# Patient Record
Sex: Female | Born: 1935 | Race: Black or African American | Hispanic: No | Smoking: Never smoker
Health system: Southern US, Community
[De-identification: ages and names within clinical notes are randomized; demographics above are authoritative.]

## PROBLEM LIST (undated history)

## (undated) DIAGNOSIS — M109 Gout, unspecified: Secondary | ICD-10-CM

## (undated) DIAGNOSIS — M1990 Unspecified osteoarthritis, unspecified site: Secondary | ICD-10-CM

## (undated) DIAGNOSIS — I482 Chronic atrial fibrillation, unspecified: Secondary | ICD-10-CM

## (undated) DIAGNOSIS — I209 Angina pectoris, unspecified: Secondary | ICD-10-CM

## (undated) DIAGNOSIS — I639 Cerebral infarction, unspecified: Secondary | ICD-10-CM

## (undated) DIAGNOSIS — C50911 Malignant neoplasm of unspecified site of right female breast: Secondary | ICD-10-CM

## (undated) DIAGNOSIS — I2510 Atherosclerotic heart disease of native coronary artery without angina pectoris: Secondary | ICD-10-CM

## (undated) DIAGNOSIS — G43909 Migraine, unspecified, not intractable, without status migrainosus: Secondary | ICD-10-CM

## (undated) DIAGNOSIS — I10 Essential (primary) hypertension: Secondary | ICD-10-CM

## (undated) DIAGNOSIS — I509 Heart failure, unspecified: Secondary | ICD-10-CM

## (undated) HISTORY — PX: TUBAL LIGATION: SHX77

## (undated) HISTORY — PX: VAGINAL HYSTERECTOMY: SUR661

## (undated) HISTORY — PX: BREAST LUMPECTOMY: SHX2

## (undated) HISTORY — PX: APPENDECTOMY: SHX54

## (undated) HISTORY — PX: CORONARY ANGIOPLASTY WITH STENT PLACEMENT: SHX49

## (undated) HISTORY — PX: HEMORROIDECTOMY: SUR656

## (undated) HISTORY — PX: TONSILLECTOMY: SUR1361

## (undated) HISTORY — PX: CATARACT EXTRACTION W/ INTRAOCULAR LENS  IMPLANT, BILATERAL: SHX1307

## (undated) HISTORY — PX: BREAST BIOPSY: SHX20

## (undated) HISTORY — PX: CHOLECYSTECTOMY: SHX55

## (undated) HISTORY — PX: MASTECTOMY: SHX3

---

## 1998-01-23 ENCOUNTER — Ambulatory Visit (HOSPITAL_COMMUNITY): Admission: RE | Admit: 1998-01-23 | Discharge: 1998-01-23 | Payer: Self-pay | Admitting: General Surgery

## 1998-02-09 ENCOUNTER — Ambulatory Visit (HOSPITAL_COMMUNITY): Admission: RE | Admit: 1998-02-09 | Discharge: 1998-02-09 | Payer: Self-pay | Admitting: Cardiology

## 1998-02-10 ENCOUNTER — Other Ambulatory Visit: Admission: RE | Admit: 1998-02-10 | Discharge: 1998-02-10 | Payer: Self-pay | Admitting: *Deleted

## 1999-03-01 ENCOUNTER — Inpatient Hospital Stay (HOSPITAL_COMMUNITY): Admission: EM | Admit: 1999-03-01 | Discharge: 1999-03-03 | Payer: Self-pay | Admitting: Emergency Medicine

## 1999-03-01 ENCOUNTER — Encounter: Payer: Self-pay | Admitting: Emergency Medicine

## 1999-04-13 ENCOUNTER — Other Ambulatory Visit: Admission: RE | Admit: 1999-04-13 | Discharge: 1999-04-13 | Payer: Self-pay | Admitting: *Deleted

## 1999-07-05 ENCOUNTER — Emergency Department (HOSPITAL_COMMUNITY): Admission: EM | Admit: 1999-07-05 | Discharge: 1999-07-05 | Payer: Self-pay | Admitting: Emergency Medicine

## 1999-07-05 ENCOUNTER — Encounter: Payer: Self-pay | Admitting: Emergency Medicine

## 1999-07-18 ENCOUNTER — Ambulatory Visit (HOSPITAL_COMMUNITY): Admission: RE | Admit: 1999-07-18 | Discharge: 1999-07-18 | Payer: Self-pay | Admitting: Cardiology

## 1999-07-18 ENCOUNTER — Encounter: Payer: Self-pay | Admitting: Cardiology

## 1999-07-24 ENCOUNTER — Inpatient Hospital Stay (HOSPITAL_COMMUNITY): Admission: RE | Admit: 1999-07-24 | Discharge: 1999-07-25 | Payer: Self-pay

## 2000-02-25 ENCOUNTER — Other Ambulatory Visit: Admission: RE | Admit: 2000-02-25 | Discharge: 2000-02-25 | Payer: Self-pay | Admitting: *Deleted

## 2000-04-03 ENCOUNTER — Encounter: Admission: RE | Admit: 2000-04-03 | Discharge: 2000-04-03 | Payer: Self-pay | Admitting: General Surgery

## 2000-04-03 ENCOUNTER — Encounter: Payer: Self-pay | Admitting: General Surgery

## 2000-09-27 ENCOUNTER — Ambulatory Visit (HOSPITAL_COMMUNITY): Admission: RE | Admit: 2000-09-27 | Discharge: 2000-09-27 | Payer: Self-pay | Admitting: Cardiology

## 2000-09-27 ENCOUNTER — Encounter: Payer: Self-pay | Admitting: Cardiology

## 2000-11-06 ENCOUNTER — Ambulatory Visit (HOSPITAL_COMMUNITY): Admission: RE | Admit: 2000-11-06 | Discharge: 2000-11-06 | Payer: Self-pay | Admitting: *Deleted

## 2000-11-13 ENCOUNTER — Ambulatory Visit (HOSPITAL_COMMUNITY): Admission: RE | Admit: 2000-11-13 | Discharge: 2000-11-13 | Payer: Self-pay | Admitting: *Deleted

## 2001-02-23 ENCOUNTER — Ambulatory Visit (HOSPITAL_COMMUNITY): Admission: RE | Admit: 2001-02-23 | Discharge: 2001-02-23 | Payer: Self-pay | Admitting: Cardiology

## 2001-02-23 ENCOUNTER — Encounter: Payer: Self-pay | Admitting: Cardiology

## 2001-02-24 ENCOUNTER — Other Ambulatory Visit: Admission: RE | Admit: 2001-02-24 | Discharge: 2001-02-24 | Payer: Self-pay | Admitting: *Deleted

## 2001-04-06 ENCOUNTER — Encounter: Payer: Self-pay | Admitting: General Surgery

## 2001-04-06 ENCOUNTER — Encounter: Admission: RE | Admit: 2001-04-06 | Discharge: 2001-04-06 | Payer: Self-pay | Admitting: General Surgery

## 2001-06-01 ENCOUNTER — Encounter: Admission: RE | Admit: 2001-06-01 | Discharge: 2001-06-01 | Payer: Self-pay | Admitting: *Deleted

## 2001-06-01 ENCOUNTER — Encounter: Payer: Self-pay | Admitting: *Deleted

## 2001-08-31 ENCOUNTER — Encounter: Payer: Self-pay | Admitting: Cardiology

## 2001-08-31 ENCOUNTER — Ambulatory Visit (HOSPITAL_COMMUNITY): Admission: RE | Admit: 2001-08-31 | Discharge: 2001-08-31 | Payer: Self-pay | Admitting: Cardiology

## 2001-09-10 ENCOUNTER — Ambulatory Visit (HOSPITAL_COMMUNITY): Admission: RE | Admit: 2001-09-10 | Discharge: 2001-09-10 | Payer: Self-pay | Admitting: Cardiology

## 2002-04-07 ENCOUNTER — Encounter: Payer: Self-pay | Admitting: General Surgery

## 2002-04-07 ENCOUNTER — Encounter: Admission: RE | Admit: 2002-04-07 | Discharge: 2002-04-07 | Payer: Self-pay | Admitting: General Surgery

## 2002-04-25 ENCOUNTER — Inpatient Hospital Stay (HOSPITAL_COMMUNITY): Admission: EM | Admit: 2002-04-25 | Discharge: 2002-04-26 | Payer: Self-pay | Admitting: Emergency Medicine

## 2002-04-25 ENCOUNTER — Encounter: Payer: Self-pay | Admitting: Emergency Medicine

## 2002-04-26 ENCOUNTER — Encounter: Payer: Self-pay | Admitting: Cardiology

## 2002-05-27 ENCOUNTER — Encounter: Admission: RE | Admit: 2002-05-27 | Discharge: 2002-08-25 | Payer: Self-pay

## 2002-07-09 ENCOUNTER — Encounter: Admission: RE | Admit: 2002-07-09 | Discharge: 2002-09-10 | Payer: Self-pay | Admitting: Neurology

## 2002-09-20 ENCOUNTER — Encounter: Admission: RE | Admit: 2002-09-20 | Discharge: 2002-10-04 | Payer: Self-pay | Admitting: Otolaryngology

## 2003-06-08 ENCOUNTER — Encounter: Admission: RE | Admit: 2003-06-08 | Discharge: 2003-06-08 | Payer: Self-pay | Admitting: General Surgery

## 2003-10-19 ENCOUNTER — Encounter: Admission: RE | Admit: 2003-10-19 | Discharge: 2003-10-19 | Payer: Self-pay | Admitting: Cardiology

## 2004-06-18 ENCOUNTER — Encounter: Admission: RE | Admit: 2004-06-18 | Discharge: 2004-06-18 | Payer: Self-pay | Admitting: General Surgery

## 2005-06-19 ENCOUNTER — Encounter: Admission: RE | Admit: 2005-06-19 | Discharge: 2005-06-19 | Payer: Self-pay | Admitting: Cardiology

## 2005-09-10 ENCOUNTER — Encounter: Admission: RE | Admit: 2005-09-10 | Discharge: 2005-09-10 | Payer: Self-pay | Admitting: Rheumatology

## 2006-03-01 ENCOUNTER — Emergency Department (HOSPITAL_COMMUNITY): Admission: EM | Admit: 2006-03-01 | Discharge: 2006-03-02 | Payer: Self-pay | Admitting: Emergency Medicine

## 2006-06-24 ENCOUNTER — Encounter: Admission: RE | Admit: 2006-06-24 | Discharge: 2006-06-24 | Payer: Self-pay | Admitting: Internal Medicine

## 2006-10-08 ENCOUNTER — Encounter (INDEPENDENT_AMBULATORY_CARE_PROVIDER_SITE_OTHER): Payer: Self-pay | Admitting: Specialist

## 2006-10-08 ENCOUNTER — Ambulatory Visit (HOSPITAL_COMMUNITY): Admission: RE | Admit: 2006-10-08 | Discharge: 2006-10-08 | Payer: Self-pay | Admitting: Gastroenterology

## 2007-07-03 ENCOUNTER — Encounter: Admission: RE | Admit: 2007-07-03 | Discharge: 2007-07-03 | Payer: Self-pay | Admitting: Internal Medicine

## 2008-01-28 ENCOUNTER — Inpatient Hospital Stay (HOSPITAL_COMMUNITY): Admission: EM | Admit: 2008-01-28 | Discharge: 2008-01-30 | Payer: Self-pay | Admitting: Emergency Medicine

## 2008-01-29 ENCOUNTER — Encounter (INDEPENDENT_AMBULATORY_CARE_PROVIDER_SITE_OTHER): Payer: Self-pay | Admitting: Internal Medicine

## 2008-01-29 ENCOUNTER — Ambulatory Visit: Payer: Self-pay | Admitting: Surgery

## 2008-03-05 ENCOUNTER — Emergency Department (HOSPITAL_COMMUNITY): Admission: EM | Admit: 2008-03-05 | Discharge: 2008-03-06 | Payer: Self-pay | Admitting: Emergency Medicine

## 2008-07-04 ENCOUNTER — Encounter: Admission: RE | Admit: 2008-07-04 | Discharge: 2008-07-04 | Payer: Self-pay | Admitting: Internal Medicine

## 2009-05-25 ENCOUNTER — Encounter: Admission: RE | Admit: 2009-05-25 | Discharge: 2009-05-25 | Payer: Self-pay | Admitting: Cardiology

## 2009-05-26 ENCOUNTER — Ambulatory Visit (HOSPITAL_COMMUNITY): Admission: RE | Admit: 2009-05-26 | Discharge: 2009-05-26 | Payer: Self-pay | Admitting: Internal Medicine

## 2009-05-30 ENCOUNTER — Ambulatory Visit (HOSPITAL_COMMUNITY): Admission: RE | Admit: 2009-05-30 | Discharge: 2009-05-30 | Payer: Self-pay | Admitting: Cardiology

## 2009-06-18 ENCOUNTER — Inpatient Hospital Stay (HOSPITAL_COMMUNITY): Admission: EM | Admit: 2009-06-18 | Discharge: 2009-06-21 | Payer: Self-pay | Admitting: Emergency Medicine

## 2009-07-06 ENCOUNTER — Encounter: Admission: RE | Admit: 2009-07-06 | Discharge: 2009-07-06 | Payer: Self-pay | Admitting: Internal Medicine

## 2010-03-30 ENCOUNTER — Emergency Department (HOSPITAL_COMMUNITY): Admission: EM | Admit: 2010-03-30 | Discharge: 2010-03-31 | Payer: Self-pay | Admitting: Emergency Medicine

## 2010-04-26 ENCOUNTER — Encounter (HOSPITAL_COMMUNITY)
Admission: RE | Admit: 2010-04-26 | Discharge: 2010-07-07 | Payer: Self-pay | Source: Home / Self Care | Attending: Rheumatology | Admitting: Rheumatology

## 2010-07-10 ENCOUNTER — Encounter
Admission: RE | Admit: 2010-07-10 | Discharge: 2010-07-10 | Payer: Self-pay | Source: Home / Self Care | Attending: Internal Medicine | Admitting: Internal Medicine

## 2010-07-18 ENCOUNTER — Emergency Department (HOSPITAL_COMMUNITY)
Admission: EM | Admit: 2010-07-18 | Discharge: 2010-07-18 | Payer: Self-pay | Source: Home / Self Care | Admitting: Emergency Medicine

## 2010-07-23 LAB — TROPONIN I: Troponin I: 0.02 ng/mL (ref 0.00–0.06)

## 2010-07-23 LAB — DIFFERENTIAL
Basophils Absolute: 0 10*3/uL (ref 0.0–0.1)
Basophils Relative: 0 % (ref 0–1)
Eosinophils Absolute: 0.2 10*3/uL (ref 0.0–0.7)
Eosinophils Relative: 4 % (ref 0–5)
Lymphocytes Relative: 31 % (ref 12–46)
Lymphs Abs: 1.7 10*3/uL (ref 0.7–4.0)
Monocytes Absolute: 0.6 10*3/uL (ref 0.1–1.0)
Monocytes Relative: 11 % (ref 3–12)
Neutro Abs: 3.1 10*3/uL (ref 1.7–7.7)
Neutrophils Relative %: 55 % (ref 43–77)

## 2010-07-23 LAB — URINALYSIS, ROUTINE W REFLEX MICROSCOPIC
Bilirubin Urine: NEGATIVE
Hgb urine dipstick: NEGATIVE
Ketones, ur: NEGATIVE mg/dL
Nitrite: POSITIVE — AB
Protein, ur: NEGATIVE mg/dL
Specific Gravity, Urine: 1.008 (ref 1.005–1.030)
Urine Glucose, Fasting: NEGATIVE mg/dL
Urobilinogen, UA: 0.2 mg/dL (ref 0.0–1.0)
pH: 5.5 (ref 5.0–8.0)

## 2010-07-23 LAB — CBC
HCT: 43.3 % (ref 36.0–46.0)
Hemoglobin: 14.5 g/dL (ref 12.0–15.0)
MCH: 27.9 pg (ref 26.0–34.0)
MCHC: 33.5 g/dL (ref 30.0–36.0)
MCV: 83.3 fL (ref 78.0–100.0)
Platelets: 169 10*3/uL (ref 150–400)
RBC: 5.2 MIL/uL — ABNORMAL HIGH (ref 3.87–5.11)
RDW: 14.8 % (ref 11.5–15.5)
WBC: 5.7 10*3/uL (ref 4.0–10.5)

## 2010-07-23 LAB — BASIC METABOLIC PANEL
BUN: 9 mg/dL (ref 6–23)
CO2: 30 mEq/L (ref 19–32)
Calcium: 9.8 mg/dL (ref 8.4–10.5)
Chloride: 103 mEq/L (ref 96–112)
Creatinine, Ser: 0.97 mg/dL (ref 0.4–1.2)
GFR calc Af Amer: >60 mL/min (ref >60)
GFR calc non Af Amer: 55 mL/min — ABNORMAL LOW (ref >60)
Glucose, Bld: 118 mg/dL — ABNORMAL HIGH (ref 70–99)
Potassium: 3.6 mEq/L (ref 3.5–5.1)
Sodium: 142 mEq/L (ref 135–145)

## 2010-07-23 LAB — CK TOTAL AND CKMB (NOT AT ARMC)
CK, MB: 1.9 ng/mL (ref 0.3–4.0)
Relative Index: INVALID (ref 0.0–2.5)
Total CK: 91 U/L (ref 7–177)

## 2010-07-23 LAB — URINE CULTURE
Colony Count: >=100000
Culture  Setup Time: 201201112252

## 2010-07-23 LAB — URINE MICROSCOPIC-ADD ON

## 2010-07-28 ENCOUNTER — Encounter: Payer: Self-pay | Admitting: Rheumatology

## 2010-08-13 ENCOUNTER — Other Ambulatory Visit: Payer: Self-pay | Admitting: Dermatology

## 2010-09-10 ENCOUNTER — Other Ambulatory Visit (HOSPITAL_COMMUNITY): Payer: Self-pay | Admitting: Rheumatology

## 2010-09-18 ENCOUNTER — Encounter (HOSPITAL_COMMUNITY): Payer: Self-pay

## 2010-09-18 ENCOUNTER — Ambulatory Visit (HOSPITAL_COMMUNITY): Payer: Self-pay

## 2010-09-18 ENCOUNTER — Other Ambulatory Visit (HOSPITAL_COMMUNITY): Payer: Self-pay

## 2010-09-20 LAB — BASIC METABOLIC PANEL
BUN: 20 mg/dL (ref 6–23)
CO2: 26 mEq/L (ref 19–32)
Calcium: 9.6 mg/dL (ref 8.4–10.5)
Chloride: 102 mEq/L (ref 96–112)
Creatinine, Ser: 0.97 mg/dL (ref 0.4–1.2)
Glucose, Bld: 131 mg/dL — ABNORMAL HIGH (ref 70–99)

## 2010-09-20 LAB — URINALYSIS, ROUTINE W REFLEX MICROSCOPIC
Nitrite: POSITIVE — AB
Specific Gravity, Urine: 1.01 (ref 1.005–1.030)
Urobilinogen, UA: 1 mg/dL (ref 0.0–1.0)

## 2010-09-20 LAB — POCT CARDIAC MARKERS: Troponin i, poc: <0.05 ng/mL (ref 0.00–0.09)

## 2010-09-20 LAB — DIFFERENTIAL
Basophils Absolute: 0 10*3/uL (ref 0.0–0.1)
Basophils Relative: 1 % (ref 0–1)
Monocytes Relative: 14 % — ABNORMAL HIGH (ref 3–12)
Neutro Abs: 3 10*3/uL (ref 1.7–7.7)
Neutrophils Relative %: 50 % (ref 43–77)

## 2010-09-20 LAB — URINE CULTURE: Colony Count: >=100000

## 2010-09-20 LAB — CBC
Hemoglobin: 13.9 g/dL (ref 12.0–15.0)
MCH: 28.1 pg (ref 26.0–34.0)
MCHC: 33.9 g/dL (ref 30.0–36.0)

## 2010-10-09 LAB — CARDIAC PANEL(CRET KIN+CKTOT+MB+TROPI)
CK, MB: 3.3 ng/mL (ref 0.3–4.0)
Relative Index: 3 — ABNORMAL HIGH (ref 0.0–2.5)
Relative Index: 3.1 — ABNORMAL HIGH (ref 0.0–2.5)
Total CK: 110 U/L (ref 7–177)
Total CK: 115 U/L (ref 7–177)
Troponin I: 0.02 ng/mL (ref 0.00–0.06)
Troponin I: 0.02 ng/mL (ref 0.00–0.06)

## 2010-10-09 LAB — URINALYSIS, ROUTINE W REFLEX MICROSCOPIC
Bilirubin Urine: NEGATIVE
Glucose, UA: NEGATIVE mg/dL
Hgb urine dipstick: NEGATIVE
Ketones, ur: 15 mg/dL — AB
Ketones, ur: NEGATIVE mg/dL
Nitrite: NEGATIVE
Protein, ur: NEGATIVE mg/dL
Protein, ur: NEGATIVE mg/dL
Specific Gravity, Urine: 1.021 (ref 1.005–1.030)
Urobilinogen, UA: 1 mg/dL (ref 0.0–1.0)
pH: 7.5 (ref 5.0–8.0)

## 2010-10-09 LAB — CBC
HCT: 41.6 % (ref 36.0–46.0)
HCT: 44.8 % (ref 36.0–46.0)
Hemoglobin: 14.2 g/dL (ref 12.0–15.0)
Hemoglobin: 15.1 g/dL — ABNORMAL HIGH (ref 12.0–15.0)
Platelets: 146 10*3/uL — ABNORMAL LOW (ref 150–400)
Platelets: 163 10*3/uL (ref 150–400)
RBC: 5.09 MIL/uL (ref 3.87–5.11)
RDW: 15.1 % (ref 11.5–15.5)
RDW: 15.3 % (ref 11.5–15.5)
WBC: 5.6 10*3/uL (ref 4.0–10.5)
WBC: 5.8 10*3/uL (ref 4.0–10.5)
WBC: 6.1 10*3/uL (ref 4.0–10.5)

## 2010-10-09 LAB — CULTURE, BLOOD (ROUTINE X 2)
Culture: NO GROWTH
Culture: NO GROWTH

## 2010-10-09 LAB — BASIC METABOLIC PANEL
BUN: 14 mg/dL (ref 6–23)
CO2: 24 mEq/L (ref 19–32)
Calcium: 9.2 mg/dL (ref 8.4–10.5)
Chloride: 107 mEq/L (ref 96–112)
GFR calc Af Amer: >60 mL/min (ref >60)
GFR calc non Af Amer: 45 mL/min — ABNORMAL LOW (ref >60)
Potassium: 3.9 mEq/L (ref 3.5–5.1)
Sodium: 140 mEq/L (ref 135–145)
Sodium: 142 mEq/L (ref 135–145)

## 2010-10-09 LAB — DIFFERENTIAL
Basophils Absolute: 0 10*3/uL (ref 0.0–0.1)
Eosinophils Relative: 1 % (ref 0–5)
Lymphocytes Relative: 42 % (ref 12–46)
Lymphs Abs: 2.4 10*3/uL (ref 0.7–4.0)
Neutro Abs: 2.6 10*3/uL (ref 1.7–7.7)

## 2010-10-09 LAB — URINE MICROSCOPIC-ADD ON

## 2010-10-09 LAB — COMPREHENSIVE METABOLIC PANEL
AST: 32 U/L (ref 0–37)
Albumin: 3.4 g/dL — ABNORMAL LOW (ref 3.5–5.2)
Alkaline Phosphatase: 94 U/L (ref 39–117)
BUN: 9 mg/dL (ref 6–23)
Creatinine, Ser: 1.09 mg/dL (ref 0.4–1.2)
GFR calc Af Amer: 58 mL/min — ABNORMAL LOW (ref >60)
Potassium: 3.7 mEq/L (ref 3.5–5.1)
Total Protein: 7.2 g/dL (ref 6.0–8.3)

## 2010-10-09 LAB — POCT CARDIAC MARKERS
CKMB, poc: 2.9 ng/mL (ref 1.0–8.0)
Myoglobin, poc: 359 ng/mL (ref 12–200)
Troponin i, poc: <0.05 ng/mL (ref 0.00–0.09)

## 2010-10-09 LAB — TROPONIN I: Troponin I: 0.01 ng/mL (ref 0.00–0.06)

## 2010-10-09 LAB — CK TOTAL AND CKMB (NOT AT ARMC): Total CK: 134 U/L (ref 7–177)

## 2010-10-10 LAB — CBC
HCT: 42.9 % (ref 36.0–46.0)
Hemoglobin: 14.6 g/dL (ref 12.0–15.0)
MCHC: 33.9 g/dL (ref 30.0–36.0)
MCV: 82.8 fL (ref 78.0–100.0)
RDW: 14.4 % (ref 11.5–15.5)

## 2010-10-10 LAB — BASIC METABOLIC PANEL
CO2: 22 mEq/L (ref 19–32)
Chloride: 111 mEq/L (ref 96–112)
GFR calc non Af Amer: 43 mL/min — ABNORMAL LOW (ref >60)
Glucose, Bld: 99 mg/dL (ref 70–99)
Potassium: 3.5 mEq/L (ref 3.5–5.1)
Sodium: 143 mEq/L (ref 135–145)

## 2010-11-20 NOTE — H&P (Signed)
NAMESHEBRA, MULDROW               ACCOUNT NO.:  [id number]   MEDICAL RECORD NO.:  [id number]          PATIENT TYPE:  INP   LOCATION:  3735                         FACILITY:  [facility]   PHYSICIAN:  Herbie Saxon, MDDATE OF BIRTH:  [birth year]   DATE OF ADMISSION:  01/28/2008  DATE OF DISCHARGE:                              HISTORY & PHYSICAL   PRIMARY CARE PHYSICIAN:  Merlene Laughter. [name], M.D.   CARDIOLOGY:  Cristy Hilts. Jacinto Halim, MD.   She is a full code, health care power of attorney is her son, Riley Lam.   PRESENTING COMPLAINTS:  Intermittent nausea, vomiting, and diarrhea, 1  week.   HISTORY OF PRESENTING COMPLAINT:  This is a 77 year old African-American  who has been having dizzy spells in the last 1 year with presyncopal  episodes, but no frank loss of consciousness.  She recently had a  Persantine stress test that was normal according to her 2 weeks ago  performed by Dr. Jacinto Halim.  The patient has been having on and off loose  watery bowel motions about 2-3 daily with nausea and vomiting episodes  as well.  No hematemesis or melena stool.  No abdominal pain.  No  jaundice.  Oral intake has been poor and she has been getting  increasingly weak, which includes dizziness on standing up.  The patient  was so weak that she was brought by the daughter because she was unable  to walk.  She denies any high-grade fever or chills.  There is no cough,  shortness of breath, or chest pain.  She has intermittent palpitations.  No headache.  There is no pedal swelling.  No dysuria, hematuria, or  urgency of urination.  No frequency.   PAST MEDICAL HISTORY:  1. Congestive heart failure.  2. Breast cancer.  3. Hypertension.  4. Osteoporosis.  5. Chronic pain.   PAST SURGERIES:  Hemorrhoidectomy, mastectomy 22 years ago, cystectomy,  appendectomy.  She had a colonoscopy 2 years ago, but she had some colon  polyps, this was performed by Dr. Loreta Ave.   FAMILY HISTORY:  Father deceased, pulmonary  TB; mother, congestive heart  failure; brother, cancer of the colon; sister, heart disease.   SOCIAL HISTORY:  The patient denies any history of alcohol, tobacco, or  illicit drug use.   ALLERGIES:  CODEINE.   MEDICATIONS:  1. Diovan/hydrochlorothiazide 160/5 mg daily.  2. Colchicine 0.6 mg t.i.d.  3. Gabapentin 300 mg b.i.d.  4. Xanax 0.5 mg t.i.d.  5. Lotrel 10/20 mg daily.  6. Toprol-XL 50 mg b.i.d.  7. Allopurinol 100 mg daily.  8. Ibuprofen 800 mg p.r.n.  9. Pravastatin 40 mg daily.   PHYSICAL EXAMINATION:  GENERAL:  She is an elderly lady, not in acute  respiratory distress.  VITAL SIGNS:  Temperature is 98, pulse is 88, respiratory rate is 18,  blood pressure 149/87 sitting, 157/84 lying.  HEENT:  Left eye ptosis.  Oropharynx and nasopharynx are clear.  Pupils  are equal, round, reactive to light and accommodation.  Mucous membranes  are dry.  NECK:  Supple.  No elevated JVD or carotid bruit.  CHEST:  Clinically clear.  No chest wall tenderness.  HEART:  Heart sounds 1 and 2 regular.  No rubs, gallops, or murmurs.  She has no rales or rhonchi.  ABDOMEN:  Soft and nontender.  Bowel sounds are normoactive.  There is  no organomegaly.  NEURO:  She is alert and oriented to time, place, and person.  Power is  4+ in all limbs.  Peripheral pulses are present.  No pedal edema.  Cranial nerves II through XII are grossly intact.  Sensory system  normal.  Peripheral pulses are reduced.  EXTREMITIES:  Cold.   LABORATORY DATA:  WBC 5.3, hematocrit 42, and platelet count 140.  Chemistries shows a sodium of 135, potassium 3.8, chloride 102,  bicarbonate 23, BUN 37, creatinine 1.7, and glucose is 95.  Urinalysis  is negative.  Chest x-ray shows cardiomegaly, MI, and vascular  congestion.  EKG shows normal sinus rhythm with sinus arrhythmia at 72  beats per minute with first-degree heart block and left axis deviation,  query anterior infarct, age undetermined.   ASSESSMENT:   Gastroenteritis; orthostasis; dehydration; dizziness and  presyncopal episodes; query cardiac arrhythmia; mild thrombocytopenia.   PLAN:  The patient will be admitted to Telemetry bed.  She is to be on  bed rest on fall precautions.  Input and output will be monitored  strictly, O2 2-4 L supplementally.  Diet to be heart-healthy.  Renal, IV  fluids with normal saline, 50 mL an hour, watching for fluid overload.  We will get a serial cardiac enzyme, EKG every 8 hours x3.  Obtain  thyroid function based on BNP.  Also, obtain PT, PTT, and INR x2.  WBC,  ova, parasite, and culture.  CV toxin.  Blood culture x2.  We will  obtain carotid Doppler and CT of brain, no contrast.  She will be  started on IV Cipro and Flagyl.  I will reduce the dose of her blood  pressure medications inhouse, hold blood pressures meds if blood  pressures is less than 110/70.  We will check a Hemoccult x2 and monitor  the CBC, BMP, and LFT in the morning.  Consider cardiology and neuro  input for presyncopal episodes.  PT/OT is to help her with ambulation.  Lovenox 30 mg subcu daily for DVT prophylaxis, Phenergan 12.5 mg IV  q.8h. p.r.n. nausea, DuoNeb 1 unit dose q.6h. p.r.n.      Herbie Saxon, MD  Electronically Signed     MIO/MEDQ  D:  01/28/2008  T:  01/29/2008  Job:  [id number]

## 2010-11-20 NOTE — Discharge Summary (Signed)
[name], [name]               ACCOUNT NO.:  [id number]   MEDICAL RECORD NO.:  [id number]          PATIENT TYPE:  INP   LOCATION:  3730                         FACILITY:  [facility]   PHYSICIAN:  Altha Harm, MDDATE OF BIRTH:  1926/12/31   DATE OF ADMISSION:  01/28/2008  DATE OF DISCHARGE:  01/30/2008                               DISCHARGE SUMMARY   DISCHARGE DISPOSITION:  Home.   FINAL DISCHARGE DIAGNOSES.:  1. Gastroenteritis, likely viral.  2. History of breast cancer.  3. History of hypertension.  4. History of osteoporosis.  5. Chronic pain.  6. History of gout.   DISCHARGE MEDICATIONS:  Include the following:  1. Colchicine 0.6 mg p.o. b.i.d.  2. Allopurinol 100 mg p.o. q.i.d.  3. Metoprolol 50 mg p.o. b.i.d.  4. Xanax 0.5 mg p.o. t.i.d.  5. Pravastatin 40 mg p.o. nightly.  6. Gabapentin 300 mg p.o. b.i.d.  7. Phenergan suppositories 12.5 mg per rectum q.8 h. p.r.n.   CONSULTANTS:  None.   PROCEDURES:  None.   DIAGNOSTIC STUDIES:  1. Abdominal x-ray, acute series which shows pulmonary cardiomegaly      without failure.  2. Mild vascular congestion.  3. Low lung volumes.  4. Nonspecific bowel gas pattern.  No evidence of obstruction or free      air.  5. CT of the head without contrast, which shows atrophy and chronic      microvascular ischemia.  No acute abnormality.  6. Carotid duplex.  It shows no significant plaquing and no ICA      stenosis.   CODE STATUS:  Full code.   ALLERGIES:  1. CODEINE  2. SULFA.   PRIMARY CARE PHYSICIAN:  1. Merlene Laughter. [name], M.D.  2. Cardiologist, Dr. Yates Decamp.  3. Gastroenterologist, Dr. Charna Elizabeth.   CHIEF COMPLAINT:  Nausea, vomiting, diarrhea x1 week.   HISTORY OF PRESENT ILLNESS:  Please see the H and P dictated by Dr.  Christella Noa, for details of the HPI.   However, this is a patient who has been having nausea, vomiting and  diarrhea, and who started having dizzy spells, more frequently than  usual.   The patient is known to have vertigo and takes meclizine  intermittently for her vertigo.  In the last 2-3 days she was having  nausea, vomiting and diarrhea daily.  She was seen by Dr. Loreta Ave, in her  office, and given Phenergan suppositories without any results.   HOSPITAL COURSE:  The patient presented to the hospital with  dehydration, dizziness, and hypertension.  It was felt that this was all  due to her volume loss with the vomiting and diarrhea.  The patient was  volume resuscitated, and the patient was able to maintain her blood  pressures without any difficulty.  Her diarrhea subsided and the patient  was able to eat and tolerated diet for the last 2 days without any  difficulty.  The patient has had no further diarrhea since she has been  hospitalized.  There was initial thought about C. diff for this patient,  however, the patient had no escalated white blood  cell count and no  abdominal pain, and no diarrhea since the idea of C. diff was abandoned.  The patient currently today is afebrile.   VITAL SIGNS:  Stable.  White blood count is 5.0.  The patient is alert  and oriented x3.  She is ambulatory, on her walker and with assistance.  LUNGS:  Clear to auscultation.  ABDOMEN:  Soft, obese, nontender, nondistended.  No masses, no  hepatosplenomegaly.  CARDIOVASCULAR:  She has got a normal S1 and S2.  No murmurs, rubs or  gallops are noted.  PMI is nondisplaced.  No heaves, thrills, or  palpitations.   Her vital signs today are as follows:  Blood pressure 124/84, heart rate  74, temperature 98.6, respiratory rate 18.   PLAN:  The patient is going to be discharged home with her usual  medications.  We will ask home health PT and OT to see the patient at  home and evaluate her.  In terms of her dietary restrictions, the  patient should be on a heart-healthy diet.  In terms of physical  restrictions, she is ambulating with a walker and she should continue to  do.   FOLLOW UP:   The patient should follow up with Dr. Andi Devon in 3-  5 days and her other specialists as needed.      Altha Harm, MD  Electronically Signed     MAM/MEDQ  D:  01/30/2008  T:  01/30/2008  Job:  [id number]   cc:   Merlene Laughter. [name], M.D.  Cristy Hilts. Jacinto Halim, MD  Anselmo Rod, M.D.

## 2010-11-23 NOTE — Op Note (Signed)
NAME:  Bither, [facility]               ACCOUNT NO.:  [id number]   MEDICAL RECORD NO.:  [id number]          PATIENT TYPE:  AMB   LOCATION:  ENDO                         FACILITY:  [facility]   PHYSICIAN:  Anselmo Rod, M.D.  DATE OF BIRTH:  1926/09/06   DATE OF PROCEDURE:  10/08/2006  DATE OF DISCHARGE:                               OPERATIVE REPORT   PROCEDURE PERFORMED:  Esophagogastroduodenoscopy with multiple gastric  biopsies.   ENDOSCOPIST:  Anselmo Rod, M.D.   INSTRUMENT USED:  Pentax video panendoscope.   INDICATIONS FOR PROCEDURE:  A 77 year old African American female  undergoing an EGD for guaiac-positive stools.  Rule out peptic ulcer  disease, esophagitis, gastritis, etc.   PREPROCEDURE PREPARATION:  Informed consent was procured from the  patient.  The patient fasted for 8 hours prior to the procedure.  Risks  and benefits of the procedure were discussed with the patient in great  detail.   PREPROCEDURE PHYSICAL:  The patient had stable vital signs.  Neck  supple.  Chest clear to auscultation.  S1 and S2 regular.  Abdomen soft,  normal bowel sounds.   DESCRIPTION OF PROCEDURE:  The patient was placed in the left lateral  decubitus position and sedated with 50 mcg of Fentanyl and 5 mg of  Versed given intravenously in slow incremental doses.  Once the patient  was adequately sedated and maintained on low-flow oxygen and continuous  cardiac monitoring the Pentax video panendoscope was advanced through  the mouthpiece over the tongue into the esophagus under direct vision.  The entire esophagus appeared normal with no evidence of ring,  stricture, mass, esophagitis, Barrett's mucosa.  The scope was then  advanced into the stomach.  There was no evidence of a hiatal hernia on  high retroflexion.  No ulcer disease was noted.  Two small sessile  polyps were biopsied from the high cardia.  There was evidence of  gastritis throughout the gastric mucosa and biopsies were  done to rule  out presence of H pylori by pathology.  The proximal small bowel  appeared normal.  There was no outlet obstruction.  The patient  tolerated the procedure well without complication.   IMPRESSION:  1. Normal-appearing esophagus with no evidence of ring, stricture,      mass, esophagitis or Barrett's mucosa.  2. Two small sessile polyp biopsied from the high cardia (cold      biopsies x2).  3. Diffuse gastritis, biopsies done for H pylori.  4. Normal proximal small bowel.   RECOMMENDATIONS:  1. Await pathology results.  2. Avoid all nonsteroidal's for now.  3. Proceed with a colonoscopy at this time.  4. Outpatient follow-up in 2 weeks for further recommendations.      Anselmo Rod, M.D.  Electronically Signed     JNM/MEDQ  D:  10/08/2006  T:  10/09/2006  Job:  [id number]   cc:   Merlene Laughter. [name], M.D.

## 2010-11-23 NOTE — Consult Note (Signed)
NAME:  [name], [name]                         ACCOUNT NO.:  [id number]   MEDICAL RECORD NO.:  [id number]                   PATIENT TYPE:  REC   LOCATION:  TPC                                  FACILITY:  [facility]   PHYSICIAN:  Zachary George, DO                      DATE OF BIRTH:  05-28-1927   DATE OF CONSULTATION:  06/25/2002  DATE OF DISCHARGE:                                   CONSULTATION   REASON FOR CONSULTATION:  The patient returns to clinic today for  reevaluation.  She was initially seen on May 31, 2002.  She has right  carpal tunnel syndrome characterized by numbness and paresthesias in her  hand and fingers including the thumb, index finger, middle finger and medial  aspect of the ring finger.  She denies any pain.  At last visit, I  prescribed a splint for her right wrist, which she is wearing at night as  well as during the day on occasion.  She feels like her symptoms have  improved slightly.  I reviewed the health and history form and 14-point  review of systems.   PHYSICAL EXAMINATION:  GENERAL:  Physical exam reveals a healthy female in  no acute distress.  VITAL SIGNS:  Blood pressure is 141/59, pulse 70, respirations 16, O2  saturations 96% on room air.  NEUROMUSCULAR:  Examination of the hands does not reveal any atrophy  bilaterally.  Manual muscle testing is 5/5, bilateral upper extremities,  with the exception of 2-3/5 strength with right index finger and middle  finger flexion.  Sensory examination revealed decreased light touch in the  right thumb, index finger, middle finger and median aspect of the ring  finger.  Muscle stretch reflexes are symmetric, bilateral upper extremities.  Tinel's test is equivocal on the right, negative on the left.   IMPRESSION:  Right carpal tunnel syndrome.   PLAN:  1. I discussed further treatment options with the patient.  I have     instructed her on local stretching and massage techniques to her carpal     tunnel.  2. Continue wearing splint.  3. I have asked her to check her multivitamin for the dose of vitamin B6, as     this may be somewhat helpful in reducing her paresthesias.  Typical doses     for neuropathy are 50 to 100 mg daily.  4. I will monitor her symptoms.  Patient to follow up in three months or     sooner as needed.  If she has significant change in condition, I have     instructed her to contact our office or her primary care Lateia Fraser, Dr.     Osvaldo Shipper.     Spruill.  5. Still waiting to receive the electrodiagnostic study report.   Patient was educated in the above findings and recommendations and  understands.  No barriers  to communication.                                               Zachary George, DO    JW/MEDQ  D:  06/25/2002  T:  06/26/2002  Job:  [id number]   cc:   Osvaldo Shipper. Spruill, M.D.  [address] [address]  [place]  [place] [address]  Fax: [phone]

## 2010-11-23 NOTE — Consult Note (Signed)
NAME:  [name], [name]                         ACCOUNT NO.:  [id number]   MEDICAL RECORD NO.:  [id number]                   PATIENT TYPE:  REC   LOCATION:  OREH                                 FACILITY:  [facility]   PHYSICIAN:  Sondra Come, D.O.                 DATE OF BIRTH:  1926/09/28   DATE OF CONSULTATION:  DATE OF DISCHARGE:  05/24/2002                                   CONSULTATION   NEW PATIENT CONSULTATION:  Dear Dr. Shana Chute:  Thank you very much for kindly referring this patient to  [facility] for evaluation.  The patient  was evaluated in our clinic today.  Please refer to the following for  details regarding the History and Physical Examination and Treatment  Recommendations.  Once again, thank you for allowing [place] to participate in  the care of this patient.   CHIEF COMPLAINT:  Numbness, right fingers.   HISTORY OF PRESENT ILLNESS:  The patient is a pleasant, 77 year old, right-  hand dominant female who complains of numbness in her right index finger,  middle finger, ring finger, and small finger for greater than one year.  She  apparently underwent a right mastectomy in 1986 with persistent numbness in  her right medial arm and ulnar forearm which has been chronic.  She denies  any history of chemotherapy or radiation therapy.  Following the mastectomy,  she did not notice any numbness or paresthesias in her right hand per se  until approximately one year ago.  According to her report, she underwent  electrodiagnostic studies of her right upper extremity which revealed carpal  tunnel syndrome, although I do not have a report of this.  She denies any  nocturnal symptoms.  She denies any pain in her right hand.  She has been  treating these symptoms with a squeeze ball.  She notes that she has some  difficulty playing the piano in church secondary to the numbness.  She  denies any neck pain.  She denies any right upper extremity  radicular pain.  Her function and quality of life indices have declined secondary to this  numbness.  Her sleep is great.  There are no provocative or palliative  features.  I reviewed Health and History form and 14-point review of  systems.  The patient complains of occasional blurring vision, dizziness,  sinus trouble, heart disease, hypertension, irregular heat beat, leg pain  with walking, ankle swelling, constipation, hemorrhoids, nausea, vomiting,  arthritis, and back pain as well as excessive worry.   PAST MEDICAL HISTORY:  Angina, breast cancer, and hypertension.   PAST SURGICAL HISTORY:  Right mastectomy, left breast lumpectomy which was  benign per her report, tonsillectomy, hemorrhoidectomy, hysterectomy,  colonoscopy, endoscopy, cardiac catheterization with stent placement x2.   FAMILY HISTORY:  Heart disease, lung disease, hypertension.   SOCIAL HISTORY:  The patient denies smoking or alcohol  use.  She is widowed  and is not currently working.  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   ALLERGIES:  Sulfa, codeine, and IVP dye.   MEDICATIONS:  Lotrel, Toprol-XL, isosorbide, Colchicine, alprazolam,  Centrum, Motrin as needed, and aspirin daily.   PHYSICAL EXAMINATION:  GENERAL:  A healthy female in no acute distress.  The  mood and affect are appropriate.  The patient is alert and oriented.  VITAL SIGNS:  Blood pressure 157/73, pulse 68, respirations 18, O2  saturation of 98% on room air.  UPPER EXTREMITIES:  Do no reveal any atrophy, especially in the intrinsic  hand muscles.  There is no heat, erythema, or edema in the upper extremities  bilaterally.  Range of motion fo the upper extremities is full including the  shoulders, elbows, wrists, and fingers.  Cranial nerves are intact.  Manual  muscle testing is 5/5 bilateral upper extremities with the exception of 4-/5  grip strength, especially in the right index finger to finger flexion.   Sensory examination reveals decreased light touch in the fingertips of the  right index finger, less than the middle finger, ring finger, and small  finger.  Muscle stretch reflexes are 2+/4 bilateral biceps, 0/4 bilateral  triceps and pronator teres, and 1+/4 bilateral brachioradialis.  Tinel test  is negative over the median and ulnar nerves at the wrist as well as the  ulnar nerve at the elbow bilaterally.  Phalen test is negative bilaterally.  Spurling maneuver is negative bilaterally.   IMPRESSION:  Right finger numbness involving the index finger, middle  finger, ring finger, and small finger with history of carpal tunnel syndrome  diagnosed by electrodiagnostic studies per patient report.  Symptoms are  somewhat atypical of carpal tunnel syndrome, however.   PLAN:  1. Will obtain a copy of the electrodiagnostic report.  2. Will write a prescription for a right wrist splint in neutral position to     wear at bedtime.  3. If symptoms are not improving, would consider occupational therapy.  4. Would consider surgical consultation as well if symptoms are not     improving, but I would like to see what the electrodiagnostic study     reveals first.  5. The patient should return to clinic in one month for re-evaluation.   The patient was educated of the above findings and recommendations and  understands.  There were no barriers to communication.                                               Sondra Come, D.O.    JJW/MEDQ  D:  05/31/2002  T:  06/01/2002  Job:  [id number]   cc:   Osvaldo Shipper. Spruill, M.D.  [address] [address]  [place]  [place] [address]  Fax: [phone]

## 2010-11-23 NOTE — Op Note (Signed)
NAME:  Vonada, [facility]               ACCOUNT NO.:  [id number]   MEDICAL RECORD NO.:  [id number]          PATIENT TYPE:  AMB   LOCATION:  ENDO                         FACILITY:  [facility]   PHYSICIAN:  Anselmo Rod, M.D.  DATE OF BIRTH:  [birth year]   DATE OF PROCEDURE:  10/08/2006  DATE OF DISCHARGE:                               OPERATIVE REPORT   PROCEDURE PERFORMED:  Screening colonoscopy.   ENDOSCOPIST:  Anselmo Rod, M.D.   INSTRUMENT USED:  Pentax video colonoscope.   INDICATIONS FOR PROCEDURE:  77 year old African American female with a  personal history of breast cancer undergoing colonoscopy for guaiac-  positive stools.  Rule out colonic polyps, masses, etc..   PREPROCEDURE PREPARATION:  Informed consent was procured from the  patient.  The patient was fasted for 8 hours prior to the procedure and  prepped with a gallon of Trilyte the night prior to the procedure.  The  risks and benefits of the procedure including a 10% miss rate of cancer  and polyp were discussed with the patient as well.   PREPROCEDURE PHYSICAL:  The patient had stable vital signs.  Neck  supple.  Chest clear to auscultation.  S1, S2 regular.  Abdomen soft  with normal bowel sounds.   DESCRIPTION OF PROCEDURE:  The patient was placed in the left lateral  decubitus position and sedated with fentanyl and Versed.  Once the  patient was adequately sedated and maintained on low-flow oxygen and  continuous cardiac monitoring.  The Pentax video colonoscope was  advanced from the rectum to the cecum with difficulty.  The patient had  very tortuous colon.  The patient's position was changed from the left  lateral to supine and the right lateral position with gentle application  of abdominal pressure to reach the cecal base.  The appendiceal orifice  and ileocecal valve were clearly visualized and photographed.  No  masses, polyps, erosions, ulcerations or diverticula seen.  Retroflexion  in the rectum  revealed small internal hemorrhoids.  The patient  tolerated the procedure well without complications.   IMPRESSION:  1. Very tortuous colon technically difficult procedure.  2. Small internal hemorrhoids seen on retroflexion.  3. No masses or polyps seen.   RECOMMENDATIONS:  1. Continue high fiber diet with liberal fluid intake.  2. Repeat colonoscopy in the next 5 years.  If the patient has a any      abnormal symptoms prior to that she is to      contact the office immediately for further recommendations.  3. Outpatient follow-up in the next 2 weeks for repeat guaiac testing,      further recommendations to be made that time.      Anselmo Rod, M.D.  Electronically Signed     JNM/MEDQ  D:  10/08/2006  T:  10/09/2006  Job:  [id number]   cc:   Merlene Laughter. [name], M.D.

## 2010-11-23 NOTE — Cardiovascular Report (Signed)
[facility]  Patient:    [name], [name] Visit Number: [id number] MRN: [id number]          Service Type: CAT Location: [facility] 2899 [id number] Attending Physician:  Robynn Pane Dictated by:   Eduardo Osier Sharyn Lull, M.D. Proc. Date: 09/10/01 Admit Date:  09/10/2001 Discharge Date: 09/10/2001   CC:         Osvaldo Shipper. Spruill, M.D.  Cardiac Catheterization Laboratory   Cardiac Catheterization  PROCEDURE: Left cardiac catheterization with selective left and right coronary angiography, left ventriculography via right groin using Judkins technique.  INDICATIONS FOR PROCEDURE: The patient is a 77 year old black female with a past medical history significant for coronary artery disease, status post PTCA and stenting to left circumflex in August of 2000, hypertension, hypercholesterolemia, history of gouty arthritis, osteoporosis, complains of squeezing chest pressure off and on for the last two months, when under stress of with minimal exertion. Relieves with rest without any associated symptoms. The patient also gives history of exertional dyspnea and feels weak and tired. Denies nausea, vomiting, diaphoresis. Denies rest or nocturnal angina, and denies PND, orthopnea, or leg swelling. The patient states she feels occasional skipping of heart beat, but denies any lightheadedness or syncope. The patient underwent Persantine Cardiolite on August 31, 2001, which showed anterior and anteroapical wall scar with peri-infarct ischemia in the basilar portion of the anterior wall with EF of 46%. The patient is referred by Dr. Shana Chute for left catheterization possible PCI.  PAST MEDICAL HISTORY: As above.  PAST SURGICAL HISTORY: She had cholecystectomy in 1998, mastectomy in 1985. Had hysterectomy in 1976.  ALLERGIES: She is allergic to CODEINE and SULFA drugs.  MEDICATIONS: 1. She takes Lotrel 5/20 p.o. q.d. 2. Lescol XL 80 mg p.o. q.d. 3. Xanax 0.5 mg p.o.  b.i.d. 4. Enteric-coated aspirin 325 mg p.o. q.d. 5. Plavix 75 mg p.o. q.d. with food which was just added.  SOCIAL HISTORY: She is widowed, retired, worked as Actor. No history of smoking or alcohol abuse.  FAMILY HISTORY: Father died of TB at the age of 19. Mother died of congestive heart failure; she was 39. One brother died of leukemia, another brother died of CA of stomach. One sister has end-stage renal disease, on hemodialysis.  PHYSICAL EXAMINATION:  GENERAL: On examination she is alert, awake, oriented x3 in no acute distress.  VITAL SIGNS: Blood pressure was 144/76, pulse was 78, regular.  HEENT: Conjunctivae pink.  NECK: Supple, no JVD, no bruits.  LUNGS: Lungs are clear to auscultation without rhonchi or rales.  CARDIOVASCULAR: Cardiovascular examination: S1 and S2 were normal. There was no S3 gallop.  ABDOMEN: Soft. Bowel sounds were present, nontender.  EXTREMITIES: There was no clubbing, cyanosis, or edema.  IMPRESSION: Accelerated angina, positive Persantine Cardiolite, coronary artery disease, status post percutaneous transluminal coronary angioplasty and stenting to left circumflex in the past. Hypertension, hypercholesterolemia, osteoporosis, history of gouty arthritis.  PLAN: Discussed at length with patient regarding left catheterization, possible PTCA and stenting, its risks and benefits i.e., death, MI, stroke, need for emergency CABG, risk of restenosis, local vascular complications, etc., and consented for PCI.  DESCRIPTION OF PROCEDURE: After obtaining the informed consent, the patient was brought to the catheterization lab and was placed on the fluoroscopy table.  The right groin was prepped and draped in the usual fashion. Xylocaine 2% was used for local anesthesia in the right groin. With the help of a thin-walled needle, a 8 French femoral arterial sheath was placed.  The sheath was aspirated and flushed. Next, a 6 French left  Judkins catheter was advanced over the wire under fluoroscopic guidance up to the ascending aorta. The wire was pulled out. The catheter was aspirated and connected to the manifold. The catheter was further advanced and engaged into the left coronary ostium. Multiple views of the left system were taken.  Next, the catheter was disengaged and was pulled out over the wire and was replaced with a 6 French right Judkins catheter which was advanced over the wire under fluoroscopic guidance up to the ascending aorta. The wire was pulled out, the catheter was aspirated and connected to the manifold. The catheter was further advanced and engaged into the right coronary ostium. Multiple views of the right system were taken. Next, the catheter was disengaged and was pulled out over the wire and was replaced with a 6 French pigtail catheter which was advanced over the wire under fluoroscopic guidance up to the ascending aorta. The wire was pulled out, the catheter was aspirated and connected to the manifold. The catheter was further advanced across the aortic valve into the LV. LV pressures were recorded. Next, LV-graphy was done in 30-degree RAO position. Post angiographic pressures were recorded from LV and then pullback pressures were recorded from the aorta.  There was no gradient across the aortic valve. Next, the pigtail catheter was pulled out over the wire, sheaths were aspirated and flushed.  FINDINGS: LV showed anterolateral basal severe hypokinesia and the rest of the LV showed mild global hypokinesia, EF of 40-45%.  Left main was patent.  LAD has 20-30% mid stenosis. Diagonal #1 to diagonal #3 were small which were patent.  Left circumflex had 20% proximal stenosis prior to the stent, which appears slightly hazy but with TIMI grade distal flow. The stented segment appears to be patent. OM-1 is less than 1.5 mm, which is patent. OM-2 is large which is  patent.  RCA is  patent.  Arteriotomy was closed by Perclose without any complications. The patient tolerated the procedure well.  The patient was transferred to the recovery room in stable condition. Dictated by:   Eduardo Osier Sharyn Lull, M.D. Attending Physician:  Robynn Pane DD:  09/10/01 TD:  09/11/01 Job: [id number]

## 2010-11-23 NOTE — Discharge Summary (Signed)
   NAME:  [name], [name]                         ACCOUNT NO.:  [id number]   MEDICAL RECORD NO.:  [id number]                   PATIENT TYPE:  INP   LOCATION:  2037                                 FACILITY:  [facility]   PHYSICIAN:  Osvaldo Shipper. Spruill, M.D.             DATE OF BIRTH:  [birth year]   DATE OF ADMISSION:  04/25/2002  DATE OF DISCHARGE:  04/26/2002                                 DISCHARGE SUMMARY   HISTORY OF PRESENT ILLNESS:  The patient is a 77 year old patient who was  admitted to the [facility] on 04/24/2002.  She presented initially  to the emergency department with complaint of chest pain and shortness of  breath.  She was evaluated in the emergency department.  At that time her  troponin was within normal limits at 0.02.  Her CK was 148, and she had an  MB of 3.6 which was within normal limits.  Her BUN was slightly elevated on  the chemistries at 29.  Creatinine, however, was normal at 1.1.  Glucose was  elevated at 132.  The patient was admitted because of what was believed to  be unstable angina by her history and also question of a TIA, and she  complained of a thick tongue sensation.  She denied at that time any  unilateral weakness.   HOSPITAL COURSE:  The patient was admitted to the medical service.  She was  placed on the rule-out-MI protocol.  She was placed on Imdur 30 mg 1 daily,  Lopressor 25 mg daily.  She was placed on Lovenox protocol by pharmacy.   On 04/26/2002, she underwent an adenosine Cardiolite study.  During that  time, she developed some problems with chest pain.  The patient also had an  MRI study done which revealed no acute CVA.  Major vessels in Cardiolite  were negative for ischemia with ejection fraction of 45%.  At this point, it  was the opinion the patient could be discharged home and her workup  continued as an outpatient on 04/26/2002.   DISCHARGE MEDICATIONS:  1. Aspirin 325 mg daily.  2. Lotrel 5/20 one daily.  3. Imdur 30  mg daily.  4. Toprol XL 50 mg 1 daily.  5. The patient is to continue her other home medications including     sublingual nitroglycerin.   DIET:  4 g sodium, fat modified diet.   FOLLOW UP:  She is to call the office in the a.m. for a followup appointment  for continuation of her outpatient care.     Ivery Quale, P.A.                       Osvaldo Shipper. Spruill, M.D.    HB/MEDQ  D:  06/09/2002  T:  06/10/2002  Job:  [id number]

## 2011-01-13 ENCOUNTER — Inpatient Hospital Stay (HOSPITAL_COMMUNITY)
Admission: EM | Admit: 2011-01-13 | Discharge: 2011-01-17 | DRG: 149 | Disposition: A | Discharge status: Home Health | Payer: Medicare Other | Attending: Internal Medicine | Admitting: Internal Medicine

## 2011-01-13 ENCOUNTER — Emergency Department (HOSPITAL_COMMUNITY): Payer: Medicare Other

## 2011-01-13 DIAGNOSIS — Z853 Personal history of malignant neoplasm of breast: Secondary | ICD-10-CM

## 2011-01-13 DIAGNOSIS — I5022 Chronic systolic (congestive) heart failure: Secondary | ICD-10-CM | POA: Diagnosis present

## 2011-01-13 DIAGNOSIS — M810 Age-related osteoporosis without current pathological fracture: Secondary | ICD-10-CM | POA: Diagnosis present

## 2011-01-13 DIAGNOSIS — I509 Heart failure, unspecified: Secondary | ICD-10-CM | POA: Diagnosis present

## 2011-01-13 DIAGNOSIS — F411 Generalized anxiety disorder: Secondary | ICD-10-CM | POA: Diagnosis present

## 2011-01-13 DIAGNOSIS — H81319 Aural vertigo, unspecified ear: Principal | ICD-10-CM | POA: Diagnosis present

## 2011-01-13 DIAGNOSIS — E785 Hyperlipidemia, unspecified: Secondary | ICD-10-CM | POA: Diagnosis present

## 2011-01-13 LAB — URINALYSIS, ROUTINE W REFLEX MICROSCOPIC
Ketones, ur: NEGATIVE mg/dL
Nitrite: NEGATIVE
Protein, ur: NEGATIVE mg/dL

## 2011-01-13 LAB — CBC
Hemoglobin: 14.5 g/dL (ref 12.0–15.0)
MCH: 28 pg (ref 26.0–34.0)
MCHC: 34.9 g/dL (ref 30.0–36.0)
RDW: 14.5 % (ref 11.5–15.5)

## 2011-01-13 LAB — COMPREHENSIVE METABOLIC PANEL
AST: 27 U/L (ref 0–37)
Albumin: 4 g/dL (ref 3.5–5.2)
Alkaline Phosphatase: 112 U/L (ref 39–117)
BUN: 13 mg/dL (ref 6–23)
Creatinine, Ser: 0.89 mg/dL (ref 0.50–1.10)
GFR calc non Af Amer: >60 mL/min (ref >60)
Sodium: 141 mEq/L (ref 135–145)
Total Bilirubin: 0.5 mg/dL (ref 0.3–1.2)

## 2011-01-13 NOTE — H&P (Signed)
[name], [name]               ACCOUNT NO.:  [id number]  MEDICAL RECORD NO.:  [id number]  LOCATION:  WLED                         FACILITY:  [facility]  PHYSICIAN:  Marinda Elk, M.D.DATE OF BIRTH:  02-10-1927  DATE OF ADMISSION:  01/13/2011 DATE OF DISCHARGE:                             HISTORY & PHYSICAL   PRIMARY CARE DOCTOR:  Merlene Laughter. [name], M.D.  CHIEF COMPLAINT:  Vertigo.  HISTORY OF PRESENT ILLNESS:  This is an 77 year old female with past medical history of vertigo also history of systolic heart failure with an EF of 35-40%, history of breast cancer, hypertension, and gout that comes in for unremitting vertigo.  The patient is a poor historian and the family left the room with I got there and most of the history is provided by the patient and the ED notes in spoke with the family.  She relates that her symptoms of vertigo started 1 week prior to admission but it is progressively getting worse.  She feels nauseated.  It got to the point so bad where she cannot walk today.  She relates no pain, some mild headache, but no other focal symptoms.  As per ED notes, her speech has not changed.  Her vision has not changed.  She still is able to swallow and understand.  She has had no fevers at home.  No falls.  In the past, the family relates that she had to be admitted for vertigo as they are persistent to the point where she cannot walk like she is doing today.  She relates the sensation as classical vertigo of room spinning around her.  She relates when she tried to get up, she still is having worsening of her vertigo.  It has been unremitting.  She had it 24x7. She relates no aggravating or associated symptoms.  She relates no burning when she urinates.  No abdominal pain.  ALLERGIES:  SULFA AND CODEINE.  PAST MEDICAL HISTORY: 1. History of viral gastroenteritis. 2. History of systolic heart failure with an EF of 35-40% by cath in     2010. 3. History of breast  cancer. 4. Hypertension. 5. Gout. 6. Osteoporosis. 7. Chronic dizziness/vertigo. 8. Hyperlipidemia.  MEDICATIONS: 1. Vitamin D3 400 mg p.o. daily. 2. Promethazine 25 mg p.o. q.6 h. 3. Prilosec 20 mg one tablet daily. 4. Pravastatin 40 mg daily. 5. Nitroglycerin 0.4 sublingual every 5 minutes p.r.n. 6. Metoprolol XL 25 mg daily. 7. Amlodipine 10 mg daily. 8. Alprazolam 0.5 mg b.i.d. 9. Acetaminophen 325 mg p.o. q.4 h. p.r.n.  FAMILY HISTORY:  Her father died of TB.  Her mother died of congestive heart failure at 77 and her father at 52.  Her brother had colon cancer.  SOCIAL HISTORY:  She denies alcohol, tobacco, or drugs.  She lives with her family.  REVIEW OF SYSTEMS:  A 10-point review of systems done, pertinent positives per HPI.  PHYSICAL EXAMINATION:  VITAL SIGNS:  Her temperature is 99, pulse 92, blood pressure 137/64.  She was satting 100% on 2 L, breathing 16 times per minute. GENERAL:  She is awake, alert, and oriented x3. HEENT:  Dry mucous membrane.  No JVD.  No bruits.  No thyromegaly. Anicteric.  No pallor.  Atraumatic, normocephalic head. CARDIOVASCULAR:  She has a regular rate and rhythm with a positive S1, S2.  No murmurs, rubs, or gallops. LUNGS:  Good air movement.  Clear to auscultation.  ABDOMEN:  Positive bowel sounds, nontender, nondistended.  Soft. EXTREMITIES:  Positive pulses.  No clubbing, cyanosis, or edema. SKIN:  No rashes or ulcerations. NEUROLOGIC:  She is awake, alert, and oriented x4, coherent and fluent language, III through XII are grossly intact except for her left eye which is always deviated to the left but she has no nystagmus, vertical or horizontal.  Her muscle strength is 5/5 in all 4 extremities. Sensation is intact throughout.  Deep tendon reflexes are 2+ bilaterally.  She does have an abnormal finger-to-nose at the beginning which eventually gets better.  It is usually with her right hand.  When I try to stood her up, the  patient is too unstable on her feet in order to get up.  It is her about 5 minutes to get out of bed, so we stopped this part of the examination.  LABORATORY DATA ON ADMISSION:  Unremarkable.  Sodium 141, potassium 3.5, chloride 103, bicarb 24, glucose of 88, BUN of 13, creatinine 0.9.  LFTs within normal limits.  Her first set of cardiac enzymes were negative x1.  Her sodium and her white count is 6.4, hemoglobin of 14.5, platelet count 151.  IMAGING:  She has no acute intracranial findings.  She has remote and extensive white matter disease.  It likely reflects sequelae of chronic microvascular ischemia.  She has had a remote superior left cerebellar infarct and also small right paramedial lipoma on the superior aspect of the tentorium.  MRA of the head showed extensive small vessel disease with distal vessel attenuation and multiple segmental stenosis, branch vessel occlusion within the inferior MAC branch bilaterally.  There was hypoplastic and atherosclerotic distal vertebral arteries bilaterally worse on the right.  ASSESSMENT AND PLAN: 1. Vertigo.  The patient is a very poor historian so we will have to     discuss with the family.  So at this time we will admit her to     telemetry.  At this time, she is not having a stroke per MRI.  We     will get physical therapy to see her.  We will start her on     meclizine and we will use Ativan in case needed.  I will also get     the input from the family to see whether she can be at home in this     condition as she seems to be more weak on her legs and not able to     get around.  This could be secondary to her vertigo and her     unsteadiness on her feet, but we will have to confirm this with     physical therapy evaluation.  We will use Ativan in case her     meclizine is not     working. 2. Hypertension, currently well-controlled.  No changes were made. 3. Chronic systolic heart failure, currently well-controlled.  No      changes were made.     Marinda Elk, M.D.     AF/MEDQ  D:  01/13/2011  T:  01/13/2011  Job:  [id number]  cc:   Merlene Laughter. [name], M.D. Fax: [phone]

## 2011-01-14 LAB — LIPID PANEL
HDL: 45 mg/dL (ref >39)
LDL Cholesterol: 73 mg/dL (ref 0–99)

## 2011-01-14 LAB — HEMOGLOBIN A1C
Hgb A1c MFr Bld: 6.1 % — ABNORMAL HIGH (ref <5.7)
Mean Plasma Glucose: 128 mg/dL — ABNORMAL HIGH (ref <117)

## 2011-01-14 LAB — TSH: TSH: 0.853 u[IU]/mL (ref 0.350–4.500)

## 2011-01-15 LAB — BASIC METABOLIC PANEL
BUN: 17 mg/dL (ref 6–23)
CO2: 30 mEq/L (ref 19–32)
Calcium: 9.2 mg/dL (ref 8.4–10.5)
Creatinine, Ser: 1.05 mg/dL (ref 0.50–1.10)

## 2011-01-16 LAB — URINALYSIS, ROUTINE W REFLEX MICROSCOPIC
Bilirubin Urine: NEGATIVE
Hgb urine dipstick: NEGATIVE
Ketones, ur: NEGATIVE mg/dL
Nitrite: NEGATIVE
Protein, ur: NEGATIVE mg/dL
Specific Gravity, Urine: 1.019 (ref 1.005–1.030)
Urobilinogen, UA: 1 mg/dL (ref 0.0–1.0)

## 2011-01-16 LAB — CBC
MCHC: 32.2 g/dL (ref 30.0–36.0)
MCV: 82.4 fL (ref 78.0–100.0)
Platelets: 148 10*3/uL — ABNORMAL LOW (ref 150–400)
RDW: 14.6 % (ref 11.5–15.5)
WBC: 6.7 10*3/uL (ref 4.0–10.5)

## 2011-01-16 LAB — BASIC METABOLIC PANEL
Calcium: 9.2 mg/dL (ref 8.4–10.5)
Chloride: 102 mEq/L (ref 96–112)
Creatinine, Ser: 1.1 mg/dL (ref 0.50–1.10)
GFR calc Af Amer: 58 mL/min — ABNORMAL LOW (ref >60)
GFR calc non Af Amer: 48 mL/min — ABNORMAL LOW (ref >60)

## 2011-01-16 LAB — MAGNESIUM: Magnesium: 2.3 mg/dL (ref 1.5–2.5)

## 2011-01-17 LAB — CBC
HCT: 40.4 % (ref 36.0–46.0)
Hemoglobin: 12.9 g/dL (ref 12.0–15.0)
MCH: 26.2 pg (ref 26.0–34.0)
MCHC: 31.9 g/dL (ref 30.0–36.0)
MCV: 82.1 fL (ref 78.0–100.0)
RBC: 4.92 MIL/uL (ref 3.87–5.11)

## 2011-01-17 LAB — BASIC METABOLIC PANEL
BUN: 20 mg/dL (ref 6–23)
CO2: 29 mEq/L (ref 19–32)
Calcium: 9.9 mg/dL (ref 8.4–10.5)
GFR calc non Af Amer: 47 mL/min — ABNORMAL LOW (ref >60)
Glucose, Bld: 120 mg/dL — ABNORMAL HIGH (ref 70–99)

## 2011-01-18 LAB — URINE CULTURE
Colony Count: >=100000
Special Requests: NEGATIVE

## 2011-01-20 NOTE — Discharge Summary (Signed)
[name], [name]               ACCOUNT NO.:  [id number]  MEDICAL RECORD NO.:  [id number]  LOCATION:  1511                         FACILITY:  [facility]  PHYSICIAN:  Kela Millin, M.D.DATE OF BIRTH:  [birth year]  DATE OF ADMISSION:  01/13/2011 DATE OF DISCHARGE:  01/17/2011                        DISCHARGE SUMMARY - REFERRING   DISCHARGE DIAGNOSES: 1. Vertigo, peripheral; MRI negative for acute findings. 2. Hypertension. 3. Hyperlipidemia. 4. Systolic heart failure with ejection fraction 35% to 40% per cath     in 2010, compensated. 5. History of breast cancer. 6. History of chronic dizziness/vertigo. 7. Osteoporosis. 8. History of gout.  PROCEDURES AND STUDIES: 1. MRI on January 13, 2011:  No acute intracranial abnormality.  Atrophy     and extensive white-matter disease.  This likely reflects a     sequelae of chronic microvascular ischemia.  Remote left superior     cerebellar infarct.  Small right para midline lipoma along the     superior aspect of the tentorium as well as persistent cavum septum     pellucidum et vergae. 2. MRA:  Extensive small-vessel disease with distal vessel attenuation     and multiple segmental stenosis.  Proximal branch vessel occlusions     within the inferior MCA branch vessels bilaterally.  Hypoplastic     and atherosclerotic distal vertebral arteries bilaterally, worse on     the right.  Fetal-type right posterior cerebral artery.  Left     greater than right PCA small-vessel disease.  BRIEF HISTORY:  The patient is an 77 year old black female with the above-listed medical problems who presented with unremitting vertigo. It was noted per admitting MD that she is a poor historian.  She reported that she had been having the vertigo for about a week and it progressively worsened.  She denied pain; also denied any focal weakness.  She admitted to a mild headache.  She also denied any visual changes.  The patient's family reported that she had  been admitted for vertigo in the past because it was persistent to the point where she was unable to walk, like she was doing on the day of admission.  She stated that the room was spinning around her.  She had an MRI done in the ED and the results are as stated above and she was admitted for further evaluation and management.  HOSPITAL COURSE: 1. Vertigo:  Upon admission, she had an MRI of her brain done and the     results are as stated above with no acute findings noted.  The     patient did report a past history of vertigo as mentioned above.     She was started on Antivert and PT, OT was consulted to see the     patient.  PT was unable to elicit any nystagmus on exam.  She     continued to have vertigo following her admission and so her     Antivert dose was scheduled and subsequently the dose increased.     She was also placed on Ativan p.r.n.  The patient's Xanax was     resumed but she was getting it more frequently in the hospital.  She later indicated that she had been taking it at home, which had     been on a p.r.n. basis only.  She had become lethargic with that     and so the Xanax dose was decreased and changed to p.r.n. dosing.     PT, OT continued to follow the patient while in the hospital and     the Occupational Therapist recommended outpatient OT for vestibular     evaluation and treatment.  The patient has improved with the above     interventions, was able to ambulate today, and has had decreased     vertigo.  She will be discharged at this time and is to follow up     with her PCP outpatient as well as vestibular PT. 2. Hypertension:  She was maintained on her outpatient medications and     is to continue them upon discharge. 3. Hyperlipidemia:  The patient is to continue her pravastatin upon     discharge. 4. Other chronic medical conditions remained stable during this     hospital stay and she is to continue her outpatient medications     except as  indicated above.  DISCHARGE MEDICATIONS: 1. Meclizine 25 mg p.o. q.i.d., hold for sedation. 2. Alprazolam 0.5 mg half a tablet b.i.d. p.r.n. 3. Tylenol 325 mg 2 tablets q.4 h p.r.n. 4. Norvasc 10 mg p.o. daily. 5. Metoprolol 25 mg p.o. daily. 6. Nitroglycerin sublingual 0.4 mg p.r.n. as previously. 7. Promethazine 25 mg p.o. q.6 h p.r.n. 8. Pravastatin 40 mg p.o. daily. 9. Prilosec 20 mg p.o. daily. 10.Vitamin D3 400 mg 1 tablet daily.  FOLLOWUP CARE:  Dr. Andi Devon in 1 week, call for appointment.  DISCHARGE CONDITION:  Improved/stable.     Kela Millin, M.D.     ACV/MEDQ  D:  01/17/2011  T:  01/17/2011  Job:  [id number]  cc:   Merlene Laughter. [name], M.D. Fax: [phone]  Electronically Signed by Donnalee Curry M.D. on 01/20/2011 07:43:48 AM

## 2011-02-05 NOTE — H&P (Signed)
NAMEATLANTIS, DELONG               ACCOUNT NO.:  [id number]  MEDICAL RECORD NO.:  [id number]  LOCATION:  1511                         FACILITY:  [facility]  PHYSICIAN:  Marinda Elk, M.D.DATE OF BIRTH:  [birth year]  DATE OF ADMISSION:  01/13/2011 DATE OF DISCHARGE:  01/17/2011                             HISTORY & PHYSICAL   PRIMARY CARE DOCTOR:  Cala Bradford R. [name], M.D.  CHIEF COMPLAINT:  Vertigo.  HISTORY OF PRESENT ILLNESS:  This is an 77 year old female with past medical history of vertigo also history of systolic heart failure with an EF of 35-40%, history of breast cancer, hypertension, and gout that comes in for unremitting vertigo.  The patient is a poor historian and the family left the room with I got there and most of the history is provided by the patient and the ED notes in spoke with the family.  She relates that her symptoms of vertigo started 1 week prior to admission but it is progressively getting worse.  She feels nauseated.  It got to the point so bad where she cannot walk today.  She relates no pain, some mild headache, but no other focal symptoms.  As per ED notes, her speech has not changed.  Her vision has not changed.  She still is able to swallow and understand.  She has had no fevers at home.  No falls.  In the past, the family relates that she had to be admitted for vertigo as they are persistent to the point where she cannot walk like she is doing today.  She relates the sensation as classical vertigo of room spinning around her.  She relates when she tried to get up, she still is having worsening of her vertigo.  It has been unremitting.  She had it 24x7. She relates no aggravating or associated symptoms.  She relates no burning when she urinates.  No abdominal pain.  ALLERGIES:  SULFA AND CODEINE.  PAST MEDICAL HISTORY: 1. History of viral gastroenteritis. 2. History of systolic heart failure with an EF of 35-40% by cath in     2010. 3.  History of breast cancer. 4. Hypertension. 5. Gout. 6. Osteoporosis. 7. Chronic dizziness/vertigo. 8. Hyperlipidemia.  MEDICATIONS: 1. Vitamin D3 400 mg p.o. daily. 2. Promethazine 25 mg p.o. q.6 h. 3. Prilosec 20 mg one tablet daily. 4. Pravastatin 40 mg daily. 5. Nitroglycerin 0.4 sublingual every 5 minutes p.r.n. 6. Metoprolol XL 25 mg daily. 7. Amlodipine 10 mg daily. 8. Alprazolam 0.5 mg b.i.d. 9. Acetaminophen 325 mg p.o. q.4 h. p.r.n.  FAMILY HISTORY:  Her father died of TB.  Her mother died of congestive heart failure at 87 and her father at 6.  Her brother had colon cancer.  SOCIAL HISTORY:  She denies alcohol, tobacco, or drugs.  She lives with her family.  REVIEW OF SYSTEMS:  A 10-point review of systems done, pertinent positives per HPI.  PHYSICAL EXAMINATION:  VITAL SIGNS:  Her temperature is 99, pulse 92, blood pressure 137/64.  She was satting 100% on 2 L, breathing 16 times per minute. GENERAL:  She is awake, alert, and oriented x3. HEENT:  Dry mucous membrane.  No JVD.  No  bruits.  No thyromegaly. Anicteric.  No pallor.  Atraumatic, normocephalic head. CARDIOVASCULAR:  She has a regular rate and rhythm with a positive S1, S2.  No murmurs, rubs, or gallops. LUNGS:  Good air movement.  Clear to auscultation.  ABDOMEN:  Positive bowel sounds, nontender, nondistended.  Soft. EXTREMITIES:  Positive pulses.  No clubbing, cyanosis, or edema. SKIN:  No rashes or ulcerations. NEUROLOGIC:  She is awake, alert, and oriented x4, coherent and fluent language, III through XII are grossly intact except for her left eye which is always deviated to the left but she has no nystagmus, vertical or horizontal.  Her muscle strength is 5/5 in all 4 extremities. Sensation is intact throughout.  Deep tendon reflexes are 2+ bilaterally.  She does have an abnormal finger-to-nose at the beginning which eventually gets better.  It is usually with her right hand.  When I try to  stood her up, the patient is too unstable on her feet in order to get up.  It is her about 5 minutes to get out of bed, so we stopped this part of the examination.  LABORATORY DATA ON ADMISSION:  Unremarkable.  Sodium 141, potassium 3.5, chloride 103, bicarb 24, glucose of 88, BUN of 13, creatinine 0.9.  LFTs within normal limits.  Her first set of cardiac enzymes were negative x1.  Her sodium and her white count is 6.4, hemoglobin of 14.5, platelet count 151.  IMAGING:  She has no acute intracranial findings.  She has remote and extensive white matter disease.  It likely reflects sequelae of chronic microvascular ischemia.  She has had a remote superior left cerebellar infarct and also small right paramedial lipoma on the superior aspect of the tentorium.  MRA of the head showed extensive small vessel disease with distal vessel attenuation and multiple segmental stenosis, branch vessel occlusion within the inferior MAC branch bilaterally.  There was hypoplastic and atherosclerotic distal vertebral arteries bilaterally worse on the right.  ASSESSMENT AND PLAN: 1. Vertigo.  The patient is a very poor historian so we will have to     discuss with the family.  So at this time we will admit her to     telemetry.  At this time, she is not having a stroke per MRI.  We     will get physical therapy to see her.  We will start her on     meclizine and we will use Ativan in case needed.  I will also get     the input from the family to see whether she can be at home in this     condition as she seems to be more weak on her legs and not able to     get around.  This could be secondary to her vertigo and her     unsteadiness on her feet, but we will have to confirm this with     physical therapy evaluation.  We will use Ativan in case her     meclizine is not     working. 2. Hypertension, currently well-controlled.  No changes were made. 3. Chronic systolic heart failure, currently  well-controlled.  No     changes were made.     Marinda Elk, M.D.     AF/MEDQ  D:  01/13/2011  T:  02/01/2011  Job:  [id number]  cc:   Merlene Laughter. [name], M.D. Fax: [phone]  Electronically Signed by Marinda Elk M.D. on 02/05/2011 07:06:14 AM

## 2011-02-26 ENCOUNTER — Ambulatory Visit: Payer: Medicare Other | Admitting: *Deleted

## 2011-04-05 LAB — BASIC METABOLIC PANEL
CO2: 24
Calcium: 9.1
Chloride: 108
Creatinine, Ser: 1.31 — ABNORMAL HIGH
GFR calc Af Amer: 46 — ABNORMAL LOW
GFR calc Af Amer: 47 — ABNORMAL LOW
GFR calc non Af Amer: 38 — ABNORMAL LOW
Potassium: 3.9
Sodium: 141

## 2011-04-05 LAB — CBC
HCT: 38.4
MCHC: 33.3
MCHC: 34
MCV: 83.5
MCV: 84.9
Platelets: 140 — ABNORMAL LOW
RBC: 4.52
RBC: 4.56
RBC: 5.04
RDW: 15.4
WBC: 5

## 2011-04-05 LAB — CULTURE, BLOOD (ROUTINE X 2)
Culture: NO GROWTH
Culture: NO GROWTH

## 2011-04-05 LAB — COMPREHENSIVE METABOLIC PANEL
ALT: 28
AST: 40 — ABNORMAL HIGH
Albumin: 3.6
CO2: 23
Calcium: 9.4
Creatinine, Ser: 1.73 — ABNORMAL HIGH
GFR calc Af Amer: 34 — ABNORMAL LOW
Sodium: 135
Total Protein: 6.8

## 2011-04-05 LAB — URINALYSIS, ROUTINE W REFLEX MICROSCOPIC
Bilirubin Urine: NEGATIVE
Hgb urine dipstick: NEGATIVE
Nitrite: NEGATIVE
Protein, ur: NEGATIVE
Specific Gravity, Urine: 1.008
Urobilinogen, UA: 0.2

## 2011-04-05 LAB — HEPATIC FUNCTION PANEL
Bilirubin, Direct: 0.2
Total Bilirubin: 0.8

## 2011-04-05 LAB — CK TOTAL AND CKMB (NOT AT ARMC)
CK, MB: 4.9 — ABNORMAL HIGH
Relative Index: 2.1
Total CK: 233 — ABNORMAL HIGH

## 2011-04-05 LAB — DIFFERENTIAL
Eosinophils Absolute: 0.1
Eosinophils Absolute: 0.2
Eosinophils Relative: 2
Lymphocytes Relative: 31
Lymphocytes Relative: 34
Lymphs Abs: 1.5
Lymphs Abs: 1.7
Monocytes Absolute: 0.6
Monocytes Relative: 11
Monocytes Relative: 14 — ABNORMAL HIGH
Neutrophils Relative %: 50

## 2011-04-05 LAB — CARDIAC PANEL(CRET KIN+CKTOT+MB+TROPI)
CK, MB: 3.9
CK, MB: 4.6 — ABNORMAL HIGH
Total CK: 142
Total CK: 185 — ABNORMAL HIGH

## 2011-04-05 LAB — CLOSTRIDIUM DIFFICILE EIA

## 2011-04-05 LAB — PROTIME-INR
INR: 1
Prothrombin Time: 13.9

## 2011-04-05 LAB — LIPASE, BLOOD: Lipase: 35

## 2011-04-05 LAB — OCCULT BLOOD X 1 CARD TO LAB, STOOL: Fecal Occult Bld: NEGATIVE

## 2011-04-05 LAB — TSH: TSH: 0.725

## 2011-04-09 ENCOUNTER — Observation Stay (HOSPITAL_COMMUNITY)
Admission: EM | Admit: 2011-04-09 | Discharge: 2011-04-10 | Disposition: A | Discharge status: Home / Self Care | Payer: Medicare Other | Attending: Internal Medicine | Admitting: Internal Medicine

## 2011-04-09 ENCOUNTER — Emergency Department (HOSPITAL_COMMUNITY): Payer: Medicare Other

## 2011-04-09 DIAGNOSIS — Z853 Personal history of malignant neoplasm of breast: Secondary | ICD-10-CM | POA: Insufficient documentation

## 2011-04-09 DIAGNOSIS — I5020 Unspecified systolic (congestive) heart failure: Secondary | ICD-10-CM | POA: Insufficient documentation

## 2011-04-09 DIAGNOSIS — R42 Dizziness and giddiness: Principal | ICD-10-CM | POA: Insufficient documentation

## 2011-04-09 DIAGNOSIS — E785 Hyperlipidemia, unspecified: Secondary | ICD-10-CM | POA: Insufficient documentation

## 2011-04-09 DIAGNOSIS — R110 Nausea: Secondary | ICD-10-CM | POA: Insufficient documentation

## 2011-04-09 DIAGNOSIS — I10 Essential (primary) hypertension: Secondary | ICD-10-CM | POA: Insufficient documentation

## 2011-04-09 DIAGNOSIS — I509 Heart failure, unspecified: Secondary | ICD-10-CM | POA: Insufficient documentation

## 2011-04-09 DIAGNOSIS — R259 Unspecified abnormal involuntary movements: Secondary | ICD-10-CM | POA: Insufficient documentation

## 2011-04-09 DIAGNOSIS — M109 Gout, unspecified: Secondary | ICD-10-CM | POA: Insufficient documentation

## 2011-04-09 DIAGNOSIS — M810 Age-related osteoporosis without current pathological fracture: Secondary | ICD-10-CM | POA: Insufficient documentation

## 2011-04-09 DIAGNOSIS — R4789 Other speech disturbances: Secondary | ICD-10-CM | POA: Insufficient documentation

## 2011-04-09 LAB — URINE MICROSCOPIC-ADD ON

## 2011-04-09 LAB — CK TOTAL AND CKMB (NOT AT ARMC)
CK, MB: 3.9 ng/mL (ref 0.3–4.0)
CK, MB: 3.7 ng/mL (ref 0.3–4.0)
Relative Index: 1.9 (ref 0.0–2.5)
Total CK: 207 U/L — ABNORMAL HIGH (ref 7–177)

## 2011-04-09 LAB — CBC
HCT: 40.6 % (ref 36.0–46.0)
Hemoglobin: 14.3 g/dL (ref 12.0–15.0)
MCH: 28.4 pg (ref 26.0–34.0)
MCHC: 35.2 g/dL (ref 30.0–36.0)
MCV: 80.6 fL (ref 78.0–100.0)

## 2011-04-09 LAB — POCT I-STAT TROPONIN I: Troponin i, poc: 0.06 ng/mL (ref 0.00–0.08)

## 2011-04-09 LAB — HEMOGLOBIN A1C: Hgb A1c MFr Bld: 5.9 % — ABNORMAL HIGH (ref <5.7)

## 2011-04-09 LAB — URINALYSIS, ROUTINE W REFLEX MICROSCOPIC
Nitrite: NEGATIVE
Specific Gravity, Urine: 1.007 (ref 1.005–1.030)
Urobilinogen, UA: 0.2 mg/dL (ref 0.0–1.0)
pH: 7.5 (ref 5.0–8.0)

## 2011-04-09 LAB — TSH: TSH: 0.948 u[IU]/mL (ref 0.350–4.500)

## 2011-04-09 LAB — LIPID PANEL
Cholesterol: 133 mg/dL (ref 0–200)
Total CHOL/HDL Ratio: 3.3 RATIO

## 2011-04-09 LAB — PROTIME-INR: INR: 1.05 (ref 0.00–1.49)

## 2011-04-09 LAB — POCT I-STAT, CHEM 8
BUN: 20 mg/dL (ref 6–23)
Creatinine, Ser: 1 mg/dL (ref 0.50–1.10)
Glucose, Bld: 104 mg/dL — ABNORMAL HIGH (ref 70–99)
Hemoglobin: 15 g/dL (ref 12.0–15.0)
Potassium: 3.9 mEq/L (ref 3.5–5.1)

## 2011-04-09 LAB — APTT: aPTT: 29 seconds (ref 24–37)

## 2011-04-10 LAB — COMPREHENSIVE METABOLIC PANEL
ALT: 18 U/L (ref 0–35)
AST: 24 U/L (ref 0–37)
CO2: 23 mEq/L (ref 19–32)
Chloride: 106 mEq/L (ref 96–112)
Creatinine, Ser: 1.27 mg/dL — ABNORMAL HIGH (ref 0.50–1.10)
GFR calc Af Amer: 44 mL/min — ABNORMAL LOW (ref >90)
GFR calc non Af Amer: 38 mL/min — ABNORMAL LOW (ref >90)
Glucose, Bld: 115 mg/dL — ABNORMAL HIGH (ref 70–99)
Sodium: 140 mEq/L (ref 135–145)
Total Bilirubin: 0.4 mg/dL (ref 0.3–1.2)

## 2011-04-10 LAB — CBC
Hemoglobin: 13.6 g/dL (ref 12.0–15.0)
MCV: 80.8 fL (ref 78.0–100.0)
Platelets: 185 10*3/uL (ref 150–400)
RBC: 4.94 MIL/uL (ref 3.87–5.11)
WBC: 5.8 10*3/uL (ref 4.0–10.5)

## 2011-04-10 LAB — URINE CULTURE

## 2011-04-18 NOTE — Discharge Summary (Signed)
[name], [name]               ACCOUNT NO.:  [id number]  MEDICAL RECORD NO.:  [id number]  LOCATION:  5505                         FACILITY:  [facility]  PHYSICIAN:  Manson Passey, MD        DATE OF BIRTH:  1926/07/18  DATE OF ADMISSION:  04/09/2011 DATE OF DISCHARGE:  04/10/2011                              DISCHARGE SUMMARY   PRIMARY CARE PHYSICIAN:  Cala Bradford R. [name], MD  DISCHARGE DIAGNOSES: 1. Vertigo. 2. Congestive heart failure with systolic dysfunction, ejection     fraction 35-40%. 3. History of atrial fibrillation. 4. Gout. 5. Dyslipidemia. 6. Hypertension. 7. Osteoporosis.  DISCHARGE MEDICATIONS: 1. Tylenol 650 mg every 4 hours as needed. 2. Aspirin 81 mg daily. 3. Zofran 4 mg every 6 hours as needed. 4. Allopurinol 100 mg daily. 5. Xanax 0.5 mg 1/2 tablet twice daily as needed. 6. Norvasc 10 mg daily. 7. Colchicine 0.6 mg daily. 8. Meclizine 25 mg 4 times daily. 9. Metoprolol succinate 25 mg daily. 10.Nitroglycerin sublingual 0.4-mg tablet every 5 minutes as needed up     to 3 doses. 11.Promethazine 25 mg every 6 hours as needed. 12.Pravastatin 40 mg daily. 13.Transdermal scopolamine 1.5 mg every 3 days topically patch. 14.Vitamin D3 400 by mouth daily.  CONSULTATIONS:  None.  DIAGNOSTIC STUDIES:  CT head without contrast - diffuse atrophy and small-vessel ischemic change.  No evidence of acute intracranial hemorrhage, mass, lesions, acute infarct.  DISCHARGE LABORATORIES:  Sodium 140, potassium 3.6, chloride 106, bicarb 23, BUN 17, creatinine 1.27, glucose 115, calcium 9.5.  White blood cells 5.8, hemoglobin 13.6, hematocrit 39.9, platelet count 185.  A1c 5.9.  TSH 0.948.  Two sets of cardiac enzymes negative.  Total cholesterol 133, triglycerides 59, HDL 40, LDL 81.  HISTORY OF PRESENT ILLNESS:  The patient is an 76 year old pleasant female with known history of vertigo, CHF, systolic dysfunction, ejection fraction 35-40%, atrial fibrillation,  dyslipidemia, hypertension, who presented to the emergency room with complaints of dizziness, feeling as if room is spinning around.  The patient denies headache, no loss of consciousness, no blurry vision.  The patient denies fever or chills.  No complaints of chest pain or cough.  No blood in the stool or urine.  No complaints of abdominal pain, nausea or vomiting, no complaints of diarrhea.  PHYSICAL EXAMINATION:  VITAL SIGNS:  Blood pressure 144/73, pulse 82, respirations 20, temperature 98.5 Fahrenheit, oxygen saturation 96% on room air. GENERAL APPEARANCE:  No acute distress, the patient appears comfortable. LUNGS:  Bilateral air entry, clear to auscultation. CARDIOVASCULAR:  Positive S1, S2, irregular rhythm, but rate controlled. ABDOMEN:  Positive bowel sounds, soft, nontender/nondistended. EXTREMITIES:  Pulses palpable bilaterally; no lower extremity edema. SKIN:  Warm, dry. NECK:  Supple, no lymphadenopathy.  HOSPITAL COURSE BY PROBLEM: 1. Vertigo.  The patient will be sent home on the home regimen of     Meclizine 25 mg 4 tablets a day.  Please follow up with Neurology     as an outpatient.  At present, the patient has no complaints of     dizziness and is clinically stable and appears well to be     discharged home.  Physical therapy and  occupational therapy was     recommended for the patient. 2. Systolic heart failure with ejection fraction of 35-40%.  The     patient will be discharged with home regimen of metoprolol     succinate 25 mg daily as well as Norvasc 10 mg daily. 3. Dyslipidemia.  Continue pravastatin 40 mg at bedtime. 4. Gout.  Continue colchicine and allopurinol as home medications.  DISPOSITION:  The patient is safe to go home.  CONDITION ON DISCHARGE:  The patient is clinically well and medically stable to be discharged home.  PT/OT was recommended on discharge.  More than 35 minutes spent discharging the patient.           ______________________________ Manson Passey, MD     AD/MEDQ  D:  04/10/2011  T:  04/10/2011  Job:  [id number]  Electronically Signed by Manson Passey MD on 04/18/2011 09:40:34 AM

## 2011-04-19 ENCOUNTER — Emergency Department (HOSPITAL_COMMUNITY)
Admission: EM | Admit: 2011-04-19 | Discharge: 2011-04-20 | Disposition: A | Discharge status: Home / Self Care | Payer: Medicare Other | Attending: Emergency Medicine | Admitting: Emergency Medicine

## 2011-04-19 DIAGNOSIS — I4891 Unspecified atrial fibrillation: Secondary | ICD-10-CM | POA: Insufficient documentation

## 2011-04-19 DIAGNOSIS — I509 Heart failure, unspecified: Secondary | ICD-10-CM | POA: Insufficient documentation

## 2011-04-19 DIAGNOSIS — Z8639 Personal history of other endocrine, nutritional and metabolic disease: Secondary | ICD-10-CM | POA: Insufficient documentation

## 2011-04-19 DIAGNOSIS — I10 Essential (primary) hypertension: Secondary | ICD-10-CM | POA: Insufficient documentation

## 2011-04-19 DIAGNOSIS — R339 Retention of urine, unspecified: Secondary | ICD-10-CM | POA: Insufficient documentation

## 2011-04-19 DIAGNOSIS — R42 Dizziness and giddiness: Secondary | ICD-10-CM | POA: Insufficient documentation

## 2011-04-19 DIAGNOSIS — Z862 Personal history of diseases of the blood and blood-forming organs and certain disorders involving the immune mechanism: Secondary | ICD-10-CM | POA: Insufficient documentation

## 2011-04-19 DIAGNOSIS — Z853 Personal history of malignant neoplasm of breast: Secondary | ICD-10-CM | POA: Insufficient documentation

## 2011-04-19 DIAGNOSIS — E7800 Pure hypercholesterolemia, unspecified: Secondary | ICD-10-CM | POA: Insufficient documentation

## 2011-04-19 DIAGNOSIS — Z79899 Other long term (current) drug therapy: Secondary | ICD-10-CM | POA: Insufficient documentation

## 2011-04-19 LAB — COMPREHENSIVE METABOLIC PANEL
Albumin: 3.9 g/dL (ref 3.5–5.2)
Alkaline Phosphatase: 95 U/L (ref 39–117)
BUN: 18 mg/dL (ref 6–23)
Chloride: 100 mEq/L (ref 96–112)
Potassium: 4 mEq/L (ref 3.5–5.1)
Total Bilirubin: 1 mg/dL (ref 0.3–1.2)

## 2011-04-19 LAB — CBC
HCT: 41.8 % (ref 36.0–46.0)
Hemoglobin: 14.6 g/dL (ref 12.0–15.0)
RBC: 5.15 MIL/uL — ABNORMAL HIGH (ref 3.87–5.11)
RDW: 14.2 % (ref 11.5–15.5)
WBC: 18.8 10*3/uL — ABNORMAL HIGH (ref 4.0–10.5)

## 2011-04-19 LAB — URINALYSIS, ROUTINE W REFLEX MICROSCOPIC
Leukocytes, UA: NEGATIVE
Nitrite: NEGATIVE
Specific Gravity, Urine: 1.02 (ref 1.005–1.030)
pH: 5 (ref 5.0–8.0)

## 2011-04-19 LAB — DIFFERENTIAL
Basophils Absolute: 0 10*3/uL (ref 0.0–0.1)
Lymphocytes Relative: 9 % — ABNORMAL LOW (ref 12–46)
Neutro Abs: 15.5 10*3/uL — ABNORMAL HIGH (ref 1.7–7.7)
Neutrophils Relative %: 83 % — ABNORMAL HIGH (ref 43–77)

## 2011-04-20 NOTE — H&P (Signed)
NAMELAREINA, [name]               ACCOUNT NO.:  [id number]  MEDICAL RECORD NO.:  [id number]  LOCATION:  [facility]                         FACILITY:  [facility]  PHYSICIAN:  Lonia Blood, M.D.      DATE OF BIRTH:  March 18, 1927  DATE OF ADMISSION:  04/09/2011 DATE OF DISCHARGE:                             HISTORY & PHYSICAL   PRIMARY CARE PHYSICIAN:  Merlene Laughter. [name], MD  PRESENTING COMPLAINT:  Weakness and dizziness.  HISTORY OF PRESENT ILLNESS:  The patient is an 77 year old female with known history of vertigo, who was seen last in July of this year, admitted into the hospital with vertigo.  At that time, the patient had full workup but did not show anything significant.  She is taking meclizine on and off for vertigo.  She came back again now with complaint of vertigo.  She did have CHF with known EF of 35%-40% and multiple other medical problems.  The patient says she had some sore throat that she remember having yesterday, but no frank upper respiratory tract infection.  No earache.  No changes in her hearing. Denied any headache, but the dizziness has been persistent.  Denied any fever or chills.  PAST MEDICAL HISTORY:  Significant for: 1. Vertigo. 2. History of CHF with systolic dysfunction, EF of 35%-40%. 3. History of atrial fibrillation. 4. Breast cancer. 5. Gout. 6. Hyperlipidemia. 7. Hypertension. 8. Osteoporosis. 9. Also past history of viral gastroenteritis.  ALLERGIES:  CODEINE and SULFA.  MEDICATIONS:  Include Allegra, colchicine, Lopressor, meclizine, nitroglycerin, Norvasc, Phenergan, pravastatin and Xanax.  SOCIAL HISTORY:  The patient lives in [place] with her family. No tobacco, alcohol, or IV drug use.  FAMILY HISTORY:  Father died at the age of 46 from tuberculosis.  Her mother died of CHF at the age of 27.  Brother had colon cancer.  REVIEW OF SYSTEMS:  All systems reviewed are negative, except per HPI.  PHYSICAL EXAMINATION:  VITAL SIGNS:   Temperature 98.3, blood pressure 115/75, pulse 84, respiratory rate 18, sats 100% on room air. GENERAL:  The patient is awake, alert, and oriented; looked miserable. She has had continued vomiting and is currently retching, otherwise no acute distress. HEENT:  PERRL.  EOMI.  No significant pallor.  No jaundice.  No rhinorrhea. NECK:  Supple with a mild JVD, but no lymphadenopathy. RESPIRATORY:  She has good air entry bilaterally.  No significant wheezing.  No rales.  No crackles. CARDIOVASCULAR SYSTEM:  She has S1 and S2.  No significant murmur. ABDOMEN:  Soft, full, nontender with positive bowel sound. EXTREMITIES:  No edema, cyanosis, or clubbing. SKIN:  No rashes or ulcers. NEURO:  Cranial nerves II through XII seems to be intact.  She has good fine motor movement.  No sensory losses.  Romberg sign is positive.  LABORATORY DATA:  Her white count is 5.8, hemoglobin 14.3 with platelets of 189.  Sodium 142, potassium 3.9, chloride 108, glucose 104, BUN 20, creatinine 1.0.  Troponin-I is 0.06.  PT 13.9, INR 1.05, and PTT of 29. Urinalysis showed cloudy urine with some moderate leukocyte esterase. Urine microscopy showed wbc's 7-20 with few bacteria.  Head CT without contrast showed diffuse atrophy  and small vessel ischemic change.  No evidence of acute intracranial hemorrhage, mass lesion, or acute infarct.  ASSESSMENT:  This is an 77 year old female with known history of recurrent vertigo among other thing that is here with what appears to be vertigo with the room spinning around and consistent nausea and vomiting.  This seems like the patient is having recurrence of her vertigo, probably triggered by upper respiratory tract infection.  PLAN: 1. Vertigo.  Admit the patient for observation.  I will put her on     monitored beds.  Get PT, OT.  We will continue with meclizine and     fall precaution.  Mainly, symptomatic control will be our target at     this point.  Once the patient  is much better and stable, we will     send her home again probably on meclizine.  I will do check some     TSH and cardiac enzymes.  Otherwise, I will not repeat the test     that we did already in July since it was just done 2 months ago.     This seems more like peripheral vertigo than anything else and we     will treat it as such.  If symptoms persist, then we may consider     repeating the MRI, but her last MRI on January 13, 2011 again was     noneventful.  She did have a remote left cerebellar infarct at that     time. 2. History of atrial fibrillation.  This seems to be in normal sinus     rhythm at this point, so we will watch her closely.  She is not on     any Coumadin it looks like, so we will just use Lovenox for DVT     prophylaxis. 3. Hypertension.  Continue with blood pressure medications. 4. CHF.  She has systolic dysfunction, which is compensated, so we     will just continue with her home medicine. 5. History of breast cancer.  Again, she is stable at this point. 6. Gout.  No acute gouty attacks, so we will watch the patient     closely.  Mainly, symptom control again is our target at this     point.  Once the patient is stable, we will discharge her home on     her home medications.     Lonia Blood, M.D.     [name]  D:  04/09/2011  T:  04/09/2011  Job:  [id number]  Electronically Signed by Lonia Blood M.D. on 04/20/2011 03:21:57 PM

## 2011-04-23 ENCOUNTER — Inpatient Hospital Stay (HOSPITAL_COMMUNITY)
Admission: EM | Admit: 2011-04-23 | Discharge: 2011-04-27 | DRG: 394 | Disposition: A | Discharge status: Home / Self Care | Payer: Medicare Other | Attending: Internal Medicine | Admitting: Internal Medicine

## 2011-04-23 ENCOUNTER — Encounter (HOSPITAL_COMMUNITY): Payer: Self-pay | Admitting: Radiology

## 2011-04-23 ENCOUNTER — Emergency Department (HOSPITAL_COMMUNITY): Payer: Medicare Other

## 2011-04-23 DIAGNOSIS — E7800 Pure hypercholesterolemia, unspecified: Secondary | ICD-10-CM | POA: Diagnosis present

## 2011-04-23 DIAGNOSIS — Z79899 Other long term (current) drug therapy: Secondary | ICD-10-CM

## 2011-04-23 DIAGNOSIS — Z9010 Acquired absence of unspecified breast and nipple: Secondary | ICD-10-CM

## 2011-04-23 DIAGNOSIS — I4891 Unspecified atrial fibrillation: Secondary | ICD-10-CM | POA: Diagnosis present

## 2011-04-23 DIAGNOSIS — K7689 Other specified diseases of liver: Secondary | ICD-10-CM | POA: Diagnosis present

## 2011-04-23 DIAGNOSIS — K219 Gastro-esophageal reflux disease without esophagitis: Secondary | ICD-10-CM | POA: Diagnosis present

## 2011-04-23 DIAGNOSIS — I10 Essential (primary) hypertension: Secondary | ICD-10-CM | POA: Diagnosis present

## 2011-04-23 DIAGNOSIS — N390 Urinary tract infection, site not specified: Secondary | ICD-10-CM | POA: Diagnosis present

## 2011-04-23 DIAGNOSIS — Z66 Do not resuscitate: Secondary | ICD-10-CM | POA: Diagnosis present

## 2011-04-23 DIAGNOSIS — E785 Hyperlipidemia, unspecified: Secondary | ICD-10-CM | POA: Diagnosis present

## 2011-04-23 DIAGNOSIS — M109 Gout, unspecified: Secondary | ICD-10-CM | POA: Diagnosis present

## 2011-04-23 DIAGNOSIS — E876 Hypokalemia: Secondary | ICD-10-CM | POA: Diagnosis present

## 2011-04-23 DIAGNOSIS — K5900 Constipation, unspecified: Secondary | ICD-10-CM | POA: Diagnosis present

## 2011-04-23 DIAGNOSIS — K602 Anal fissure, unspecified: Principal | ICD-10-CM | POA: Diagnosis present

## 2011-04-23 DIAGNOSIS — R112 Nausea with vomiting, unspecified: Secondary | ICD-10-CM | POA: Diagnosis present

## 2011-04-23 DIAGNOSIS — Z882 Allergy status to sulfonamides status: Secondary | ICD-10-CM

## 2011-04-23 DIAGNOSIS — I5032 Chronic diastolic (congestive) heart failure: Secondary | ICD-10-CM | POA: Diagnosis present

## 2011-04-23 DIAGNOSIS — Z8249 Family history of ischemic heart disease and other diseases of the circulatory system: Secondary | ICD-10-CM

## 2011-04-23 DIAGNOSIS — K648 Other hemorrhoids: Secondary | ICD-10-CM | POA: Diagnosis present

## 2011-04-23 DIAGNOSIS — Z853 Personal history of malignant neoplasm of breast: Secondary | ICD-10-CM

## 2011-04-23 DIAGNOSIS — I509 Heart failure, unspecified: Secondary | ICD-10-CM | POA: Diagnosis present

## 2011-04-23 HISTORY — DX: Essential (primary) hypertension: I10

## 2011-04-23 HISTORY — DX: Heart failure, unspecified: I50.9

## 2011-04-23 LAB — LIPASE, BLOOD: Lipase: 42 U/L (ref 11–59)

## 2011-04-23 LAB — OCCULT BLOOD, POC DEVICE: Fecal Occult Bld: POSITIVE

## 2011-04-23 LAB — CBC
MCH: 28.1 pg (ref 26.0–34.0)
MCHC: 35 g/dL (ref 30.0–36.0)
MCV: 80.2 fL (ref 78.0–100.0)
Platelets: 180 10*3/uL (ref 150–400)
RDW: 13.9 % (ref 11.5–15.5)
WBC: 8 10*3/uL (ref 4.0–10.5)

## 2011-04-23 LAB — COMPREHENSIVE METABOLIC PANEL
Alkaline Phosphatase: 77 U/L (ref 39–117)
BUN: 29 mg/dL — ABNORMAL HIGH (ref 6–23)
CO2: 22 mEq/L (ref 19–32)
Chloride: 101 mEq/L (ref 96–112)
GFR calc Af Amer: 58 mL/min — ABNORMAL LOW (ref >90)
Glucose, Bld: 96 mg/dL (ref 70–99)
Potassium: 3.6 mEq/L (ref 3.5–5.1)
Total Bilirubin: 0.7 mg/dL (ref 0.3–1.2)

## 2011-04-23 LAB — DIFFERENTIAL
Eosinophils Absolute: 0.1 10*3/uL (ref 0.0–0.7)
Eosinophils Relative: 2 % (ref 0–5)
Lymphs Abs: 2.3 10*3/uL (ref 0.7–4.0)
Monocytes Relative: 14 % — ABNORMAL HIGH (ref 3–12)

## 2011-04-23 LAB — TYPE AND SCREEN: ABO/RH(D): B POS

## 2011-04-23 MED ORDER — IOHEXOL 300 MG/ML  SOLN: 80.0000 mL | Freq: Once | INTRAMUSCULAR | Status: AC | PRN

## 2011-04-23 NOTE — H&P (Signed)
[name], [name]               ACCOUNT NO.:  [id number]  MEDICAL RECORD NO.:  [id number]  LOCATION:  [facility]                         FACILITY:  [facility]  PHYSICIAN:  Lauren Pray, MD      DATE OF BIRTH:  06-Oct-1926  DATE OF ADMISSION:  04/23/2011 DATE OF DISCHARGE:                             HISTORY & PHYSICAL   PRIMARY CARE PHYSICIAN:  [name]. Lauren Gloss, MD  CODE STATUS:  DNR.  The patient goes to Team 6.  CHIEF COMPLAINT:  Severe rectal pain.  HISTORY OF PRESENT ILLNESS:  This is a 77 year old female who is here with complaints of severe rectal pain.  She says it has been going on for 2-3 days. It is quite severe. She does have a history of chronic constipation and strain chronically.  She also has  diarrhea in between. The patient states she has frequent blood in the stool. She has had a colonoscopy for this in the past.  The most recent was in 2008 with not very significant findings.  This was done by Dr. Loreta Ave. She was found to have very tortuous colon and small internal hemorrhoids.   The patient has had does have a history of hemorrhoidectomy.  In fact here in the ER, the patient was guaiac positive.  Her hemoglobin  appears to be stable. In the ER, the patient did receive Dilaudid which  initially appeared to have taken care for pain; however, she when was about  to be discharged  she again developed severe rectal pain.  The patient has a very positive review of systems.  She reports chronic nausea and vomiting.  She states she is tremulous.  She has poor appetite.  She has difficulty swallowing.  She has shortness of breath.  She is always weak.  This is a weak and frail-appearing female.  She does not report any fevers and chills.  She does not report any burning on urination. She does report some occasional chest pain, but this is stable over years. History obtained from the patient, who is a fair historian.   REVIEW OF SYSTEMS:  As best able to as noted in the  HPI, all 10-point systems reviewed and otherwise negative.  PAST MEDICAL HISTORY:  Includes CHF, hypertension, AFib, breast cancer, gout, dyslipidemia, vertigo.  PAST SURGICAL HISTORY:  Includes appendectomy, mastectomy, and hemorrhoidectomy.  MEDICATIONS: 1. Nitroglycerin p.r.n. 2. Tylenol p.r.n. 3. Promethazine p.r.n. 4. Pravastatin 40 mg daily. 5. Zofran p.r.n. 6. Colchicine 0.6 mg. 7. Aspirin 81 mg p.r.n. 8. Norvasc 10 mg daily. 9. Alprazolam 0.5 mg twice daily. 10.Allopurinol 100 mg daily.  ALLERGIES:  CODEINE and SULFA.  SOCIAL HISTORY:  Negative tobacco, alcohol, illicit drugs.  She lives with her family.  She walks with a walker.  She is on home oxygen.  FAMILY HISTORY:  Significant for coronary artery disease and cancer.  PHYSICAL EXAMINATION:  VITAL SIGNS:  Blood pressure 139/76, pulse 103, currently 90, respirations 20, temperature 98.4, satting 100% on room air. GENERAL:  Weak, frail-appearing female in moderate distress. HEENT:  Eyes, pink conjunctivae.  PERRLA.  ENT, dry oral mucosa. Trachea midline. NECK:  Supple. LUNGS:  Clear to auscultation.  No wheeze appreciated.  No use of accessory muscles. CARDIOVASCULAR:  Regular rate and rhythm, but tachycardic.  No murmurs appreciated. ABDOMEN:  Soft.  No significant tenderness to palpation.  No rebound, not an acute surgical abdomen.  Unable to appreciate organomegaly. NEURO:  Cranial nerves II through XII appear to be grossly intact. Sensation decreased bilateral lower extremity. MUSCULOSKELETAL:  Strength, the patient is generally weak.  Strength appears to be approximately 3-1/2 out of 5 in all extremities; however, this could be effort dependent. SKIN:  No rashes, no subcutaneous crepitation, no decubitus.  LABORATORY DATA:  CT abdomen and pelvis shows nonspecific mild rectal wall thickening, could be related to inflammation from hemorrhoids, but visualization recommended.  There is concern for rectal  carcinoma. Lipase is 42, sodium 138, potassium 3.6, chloride 101, CO2 of 22, glucose 96, BUN 12, creatinine 1.201.  LFTs are normal.  INR 1.08. White blood count 8, hemoglobin 15.3, platelets 180.  Occult blood is positive.  EKG which is normal sinus rhythm with no ST-segment changes, tachycardia.  ASSESSMENT AND PLAN: 1. Severe rectal pain. 2. Guaiac-positive stools. 3. Mildly abnormal CT of the rectal wall.  The patient will be     admitted.  She will be placed on a clear liquid diet.  She will be     placed on gentle hydration with the consult to GI with a question     of a colonoscopy.  We will place the patient on MiraLAX for     possible constipation.  To be discontinued if the patient shows     evidence of any overt diarrhea.  We will also check C. diff toxin.     Pain medications will be ordered p.r.n. 4. Question of chest pain.  The patient states it has been going on     for a long time with no escalation.  The patient did have a heart     cath in 2010 which showed the EF of 35-45%, but no significant     stenosis noted.  I will order an echo. 5. Chronic nausea and vomiting.  I will order a PPI. 6. Dyslipidemia. 7. History of atrial fibrillation.  We will resume home medication. 8. History of breast cancer. Stable.  SCD's, PPI prophylaxis         ______________________________ Lauren Pray, MD     DC/MEDQ  D:  04/23/2011  T:  04/23/2011  Job:  [id number]  Electronically Signed by Lauren Pray MD on 04/23/2011 08:28:17 PM

## 2011-04-24 DIAGNOSIS — I517 Cardiomegaly: Secondary | ICD-10-CM

## 2011-04-24 LAB — CEA: CEA: 4.8 ng/mL (ref 0.0–5.0)

## 2011-04-24 LAB — URINE MICROSCOPIC-ADD ON

## 2011-04-24 LAB — CBC
HCT: 37.5 % (ref 36.0–46.0)
Hemoglobin: 11.6 g/dL — ABNORMAL LOW (ref 12.0–15.0)
Hemoglobin: 12.9 g/dL (ref 12.0–15.0)
MCV: 79.2 fL (ref 78.0–100.0)
MCV: 80.6 fL (ref 78.0–100.0)
Platelets: 167 10*3/uL (ref 150–400)
RBC: 4.43 MIL/uL (ref 3.87–5.11)
RBC: 4.65 MIL/uL (ref 3.87–5.11)
WBC: 6 10*3/uL (ref 4.0–10.5)
WBC: 6.6 10*3/uL (ref 4.0–10.5)

## 2011-04-24 LAB — URINALYSIS, ROUTINE W REFLEX MICROSCOPIC
Glucose, UA: NEGATIVE mg/dL
Specific Gravity, Urine: 1.007 (ref 1.005–1.030)
Urobilinogen, UA: 1 mg/dL (ref 0.0–1.0)
pH: 8 (ref 5.0–8.0)

## 2011-04-24 LAB — CANCER ANTIGEN 27.29: CA 27.29: 40 U/mL — ABNORMAL HIGH (ref 0–39)

## 2011-04-24 LAB — BASIC METABOLIC PANEL
CO2: 23 mEq/L (ref 19–32)
Chloride: 102 mEq/L (ref 96–112)
Sodium: 136 mEq/L (ref 135–145)

## 2011-04-25 LAB — BASIC METABOLIC PANEL
Chloride: 103 mEq/L (ref 96–112)
Creatinine, Ser: 0.77 mg/dL (ref 0.50–1.10)
GFR calc Af Amer: 88 mL/min — ABNORMAL LOW (ref >90)

## 2011-04-25 LAB — CBC
MCV: 79.6 fL (ref 78.0–100.0)
Platelets: 186 10*3/uL (ref 150–400)
RDW: 14 % (ref 11.5–15.5)
WBC: 6.3 10*3/uL (ref 4.0–10.5)

## 2011-04-25 LAB — CLOSTRIDIUM DIFFICILE BY PCR: Toxigenic C. Difficile by PCR: NEGATIVE

## 2011-04-25 LAB — URINE CULTURE: Culture  Setup Time: 201210180146

## 2011-04-27 LAB — CBC
HCT: 44.3 % (ref 36.0–46.0)
MCH: 27.2 pg (ref 26.0–34.0)
MCV: 80.7 fL (ref 78.0–100.0)
MCV: 81.2 fL (ref 78.0–100.0)
Platelets: 192 10*3/uL (ref 150–400)
RBC: 5.49 MIL/uL — ABNORMAL HIGH (ref 3.87–5.11)
RDW: 14 % (ref 11.5–15.5)
RDW: 13.9 % (ref 11.5–15.5)
WBC: 3.9 10*3/uL — ABNORMAL LOW (ref 4.0–10.5)

## 2011-04-27 LAB — BASIC METABOLIC PANEL
BUN: 16 mg/dL (ref 6–23)
CO2: 22 mEq/L (ref 19–32)
CO2: 23 mEq/L (ref 19–32)
Calcium: 9.4 mg/dL (ref 8.4–10.5)
Chloride: 102 mEq/L (ref 96–112)
Creatinine, Ser: 1.02 mg/dL (ref 0.50–1.10)
Creatinine, Ser: 1.05 mg/dL (ref 0.50–1.10)
Glucose, Bld: 89 mg/dL (ref 70–99)
Glucose, Bld: 103 mg/dL — ABNORMAL HIGH (ref 70–99)

## 2011-04-28 NOTE — Discharge Summary (Signed)
[name], [name]               ACCOUNT NO.:  [id number]  MEDICAL RECORD NO.:  [id number]  LOCATION:  4733                         FACILITY:  [facility]  PHYSICIAN:  Debbora Presto, MD DATE OF BIRTH:  Nov 22, 1926  DATE OF ADMISSION:  04/23/2011 DATE OF DISCHARGE:  04/27/2011                              DISCHARGE SUMMARY   CODE STATUS:  DNR.  DISCHARGE MEDICATIONS: 1. Ciprofloxacin 500 mg tablet by mouth twice daily, take for 3 more     days 2. Diltiazem 2% gel 4 times daily applied to anal fissure. 3. Colace 100 mg capsule, take 200 mg tablet by mouth twice daily. 4. Lidocaine 5% topical ointment 3 times a day to affected area. 5. MiraLax powder every 4 hours. 6. Tramadol 50 mg tablet by mouth every 6 hours as needed. 7. Tylenol 325 mg tablet, take 2 tablets every 6 hours as needed. 8. Allopurinol 100 mg tablet daily. 9. Xanax 0.5 mg tablet twice daily. 10.Amlodipine 10 mg tablet daily. 11.Aspirin 81 mg tablet daily as needed for headaches. 12.Colchicine 0.6 mg tablet daily. 13.Nitroglycerin 0.4 mg sublingual for chest pain under Lauren tongue     every 5 minutes as needed, up to 3 doses. 14.Zofran 4 mg tablet every 6 hours as needed for nausea. 15.Pravastatin 40 mg tablet daily at bedtime. 16.Promethazine 25 mg tablet 3 times a day.  DISCHARGE DIAGNOSES: 1. Rectal pain-secondary to anal fissure. 2. Chest pain-resolved during Lauren hospitalization. 3. Chronic diastolic congestive heart failure-stable. 4. History of atrial fibrillation. 5. History of breast cancer. 6. Dyslipidemia. 7. Hypertension  DISPOSITION AND FOLLOWUP:  [name] was discharged from Lauren hospital in stable condition.  Discussed plan and management with son who is taking [name] home with [name] to resume Lauren care at home.  He is power of attorney and phone number is [phone].  Specific instructions given to son and [name] to apply diltiazem gel 2% to anal fissure 4 times daily.  Also applying  lidocaine gel as instructed per Lauren discharge medication list.  Per GI recommendation, [name] needs to avoid all narcotics and resume MiraLax and Colace as noted above under medication list to ensure regular bowel movements, avoiding constipation.  Sitz baths recommended as well.  DIAGNOSTIC STUDIES:  During Lauren hospitalization, CT of Lauren abdomen and pelvis on April 23, 2011, nonspecific mild rectal wall thickening, questionable inflammation from hemorrhoids but direct visualization not possible concerning for rectal carcinoma.  CONSULTS:  GI, Jyothi Elsie Amis, MD, [facility]  HISTORY OF PRESENT ILLNESS:  [name] is a very pleasant 77 year old female with a history outlined above, who presents to [facility] with severe rectal pain and chronic constipation.  [name] reports having to strain chronically in order to have bowel movements and most often [name] bowel movements come every 3-4 days.  [name] has had colonoscopy in Lauren past, but is not sure what Lauren findings were, of note.  Per records, we saw that colonoscopy last one done was in 2008 with no significant findings.  However, it was found that [name] has tortuous colon and small internal hemorrhoids.  [name] also reported intermittent chest pain, nonradiating, 2/10 in severity with no specific aggravating  or alleviating factors.  PHYSICAL EXAMINATION:  VITAL SIGNS:  Upon discharge, temperature 98.6, pulse 83, respirations 20, blood pressure 103/65, saturating 95% on room air. GENERAL:  Sitting in bed, not in acute distress. CARDIOVASCULAR:  Regular rate and rhythm.  S1 and S2 present. ABDOMEN:  Soft, nontender, and nondistended.  Bowel sounds present. LUNGS:  Clear to auscultation bilaterally.  No wheezing, rhonchi, or rales. EXTREMITIES:  No edema. NEUROLOGIC:  Nonfocal.  LABORATORY DATA:  Blood work:  Sodium 136, potassium 3.9, chloride 103, bicarb 20, glucose 86, BUN 11, creatinine 0.77.  WBC 6.3, hemoglobin 12.8,  hematocrit 37, platelets 196.  Urine culture:  Positive for 100,000 colonies of multiple bacterial morphotypes present.  HOSPITAL COURSE BY PROBLEM: 1. Rectal pain-likely secondary to anal fissure.  [name] has     received care during Lauren hospitalization with diltiazem gel,     lidocaine topical ointment.  [name] was also given MiraLax and Colace     during Lauren hospitalization.  Bowel movements were reported by Lauren     Jackson and [name] nurse.  [name] will need to resume Lauren same regimen at     home, and this was discussed with son at bedside as well as Lauren     Jackson and I have also recommended sitz baths, perhaps once daily     for 20 minutes.  [name] was also advised to contact Dr. Kenna Gilbert     office for followup appointment in next 4 weeks. 2. Chronic diastolic CHF-stable during Lauren hospitalization.  2-D echo     indicated EF of 35%-40% consistent with grade 1 diastolic     dysfunction. 3. Hypertension.  [name] is slightly hypotensive at baseline,     blood pressure running in low 100-110/60s.  This has been well     controlled during Lauren hospitalization. 4. Urinary tract infection.  [name] was initially started on     Rocephin and was discharged on ciprofloxacin 500 mg twice daily to     take for additional 3 days. 5. Right red blood per rectum.  This is likely secondary to number 1-     hemoglobin and hematocrit remained stable during Lauren     hospitalization and no bleeding reported.  Over 30 minutes spent on discharging [name].     Debbora Presto, MD     IM/MEDQ  D:  04/27/2011  T:  04/27/2011  Job:  [id number]  cc:   Merlene Laughter. [name], M.D. Anselmo Rod, MD, [facility]  Electronically Signed by Debbora Presto MD on 04/28/2011 04:16:51 PM

## 2011-04-30 ENCOUNTER — Inpatient Hospital Stay (HOSPITAL_COMMUNITY)
Admission: EM | Admit: 2011-04-30 | Discharge: 2011-05-06 | DRG: 065 | Disposition: A | Discharge status: Skilled Nursing Facility | Payer: Medicare Other | Attending: Internal Medicine | Admitting: Internal Medicine

## 2011-04-30 ENCOUNTER — Emergency Department (HOSPITAL_COMMUNITY): Payer: Medicare Other

## 2011-04-30 ENCOUNTER — Inpatient Hospital Stay (HOSPITAL_COMMUNITY): Payer: Medicare Other

## 2011-04-30 DIAGNOSIS — I6340 Cerebral infarction due to embolism of unspecified cerebral artery: Principal | ICD-10-CM | POA: Diagnosis present

## 2011-04-30 DIAGNOSIS — I10 Essential (primary) hypertension: Secondary | ICD-10-CM | POA: Diagnosis present

## 2011-04-30 DIAGNOSIS — I5042 Chronic combined systolic (congestive) and diastolic (congestive) heart failure: Secondary | ICD-10-CM | POA: Diagnosis present

## 2011-04-30 DIAGNOSIS — Q2111 Secundum atrial septal defect: Secondary | ICD-10-CM

## 2011-04-30 DIAGNOSIS — I509 Heart failure, unspecified: Secondary | ICD-10-CM | POA: Diagnosis present

## 2011-04-30 DIAGNOSIS — Z9010 Acquired absence of unspecified breast and nipple: Secondary | ICD-10-CM

## 2011-04-30 DIAGNOSIS — R4701 Aphasia: Secondary | ICD-10-CM | POA: Diagnosis present

## 2011-04-30 DIAGNOSIS — Q211 Atrial septal defect: Secondary | ICD-10-CM

## 2011-04-30 DIAGNOSIS — Z79899 Other long term (current) drug therapy: Secondary | ICD-10-CM

## 2011-04-30 DIAGNOSIS — R471 Dysarthria and anarthria: Secondary | ICD-10-CM | POA: Diagnosis present

## 2011-04-30 DIAGNOSIS — M810 Age-related osteoporosis without current pathological fracture: Secondary | ICD-10-CM | POA: Diagnosis present

## 2011-04-30 DIAGNOSIS — Z853 Personal history of malignant neoplasm of breast: Secondary | ICD-10-CM

## 2011-04-30 DIAGNOSIS — Z7901 Long term (current) use of anticoagulants: Secondary | ICD-10-CM

## 2011-04-30 DIAGNOSIS — Z7902 Long term (current) use of antithrombotics/antiplatelets: Secondary | ICD-10-CM

## 2011-04-30 DIAGNOSIS — E785 Hyperlipidemia, unspecified: Secondary | ICD-10-CM | POA: Diagnosis present

## 2011-04-30 DIAGNOSIS — E7800 Pure hypercholesterolemia, unspecified: Secondary | ICD-10-CM | POA: Diagnosis present

## 2011-04-30 DIAGNOSIS — M109 Gout, unspecified: Secondary | ICD-10-CM | POA: Diagnosis present

## 2011-04-30 DIAGNOSIS — I4891 Unspecified atrial fibrillation: Secondary | ICD-10-CM | POA: Diagnosis present

## 2011-04-30 DIAGNOSIS — Z882 Allergy status to sulfonamides status: Secondary | ICD-10-CM

## 2011-04-30 DIAGNOSIS — Z8249 Family history of ischemic heart disease and other diseases of the circulatory system: Secondary | ICD-10-CM

## 2011-04-30 DIAGNOSIS — G8190 Hemiplegia, unspecified affecting unspecified side: Secondary | ICD-10-CM | POA: Diagnosis present

## 2011-04-30 LAB — COMPREHENSIVE METABOLIC PANEL
ALT: 19 U/L (ref 0–35)
AST: 30 U/L (ref 0–37)
Albumin: 3.3 g/dL — ABNORMAL LOW (ref 3.5–5.2)
Calcium: 9.8 mg/dL (ref 8.4–10.5)
Potassium: 3.8 mEq/L (ref 3.5–5.1)
Sodium: 138 mEq/L (ref 135–145)
Total Protein: 7.3 g/dL (ref 6.0–8.3)

## 2011-04-30 LAB — URINALYSIS, ROUTINE W REFLEX MICROSCOPIC
Hgb urine dipstick: NEGATIVE
Ketones, ur: NEGATIVE mg/dL
Protein, ur: NEGATIVE mg/dL
Urobilinogen, UA: 0.2 mg/dL (ref 0.0–1.0)

## 2011-04-30 LAB — DIFFERENTIAL
Basophils Absolute: 0 10*3/uL (ref 0.0–0.1)
Basophils Relative: 1 % (ref 0–1)
Eosinophils Relative: 3 % (ref 0–5)
Lymphocytes Relative: 16 % (ref 12–46)
Monocytes Absolute: 0.6 10*3/uL (ref 0.1–1.0)
Monocytes Relative: 7 % (ref 3–12)

## 2011-04-30 LAB — CBC
HCT: 39.2 % (ref 36.0–46.0)
MCH: 26.2 pg (ref 26.0–34.0)
MCHC: 31.9 g/dL (ref 30.0–36.0)
RDW: 14.1 % (ref 11.5–15.5)

## 2011-04-30 LAB — CARDIAC PANEL(CRET KIN+CKTOT+MB+TROPI)
CK, MB: 3.3 ng/mL (ref 0.3–4.0)
Relative Index: INVALID (ref 0.0–2.5)
Total CK: 97 U/L (ref 7–177)
Troponin I: <0.3 ng/mL (ref <0.30)

## 2011-04-30 LAB — APTT: aPTT: 30 seconds (ref 24–37)

## 2011-04-30 LAB — URINE MICROSCOPIC-ADD ON

## 2011-04-30 LAB — PROTIME-INR: INR: 1.05 (ref 0.00–1.49)

## 2011-04-30 LAB — GLUCOSE, CAPILLARY

## 2011-05-01 LAB — URINALYSIS, ROUTINE W REFLEX MICROSCOPIC
Nitrite: NEGATIVE
Specific Gravity, Urine: 1.015 (ref 1.005–1.030)
Urobilinogen, UA: 0.2 mg/dL (ref 0.0–1.0)
pH: 5 (ref 5.0–8.0)

## 2011-05-01 LAB — HEMOGLOBIN A1C
Hgb A1c MFr Bld: 5.8 % — ABNORMAL HIGH (ref <5.7)
Mean Plasma Glucose: 120 mg/dL — ABNORMAL HIGH (ref <117)

## 2011-05-01 LAB — URINE MICROSCOPIC-ADD ON

## 2011-05-01 LAB — GLUCOSE, CAPILLARY: Glucose-Capillary: 83 mg/dL (ref 70–99)

## 2011-05-01 LAB — LIPID PANEL
Cholesterol: 122 mg/dL (ref 0–200)
VLDL: 12 mg/dL (ref 0–40)

## 2011-05-02 ENCOUNTER — Inpatient Hospital Stay (HOSPITAL_COMMUNITY): Payer: Medicare Other

## 2011-05-02 DIAGNOSIS — I6789 Other cerebrovascular disease: Secondary | ICD-10-CM

## 2011-05-02 DIAGNOSIS — I69920 Aphasia following unspecified cerebrovascular disease: Secondary | ICD-10-CM

## 2011-05-02 DIAGNOSIS — I69991 Dysphagia following unspecified cerebrovascular disease: Secondary | ICD-10-CM

## 2011-05-02 LAB — GLUCOSE, CAPILLARY
Glucose-Capillary: 117 mg/dL — ABNORMAL HIGH (ref 70–99)
Glucose-Capillary: 90 mg/dL (ref 70–99)

## 2011-05-03 ENCOUNTER — Inpatient Hospital Stay (HOSPITAL_COMMUNITY): Payer: Medicare Other

## 2011-05-03 LAB — GLUCOSE, CAPILLARY: Glucose-Capillary: 76 mg/dL (ref 70–99)

## 2011-05-05 LAB — PROTIME-INR: Prothrombin Time: 14.4 seconds (ref 11.6–15.2)

## 2011-05-06 LAB — CBC
HCT: 36 % (ref 36.0–46.0)
Hemoglobin: 12.1 g/dL (ref 12.0–15.0)
MCH: 27.4 pg (ref 26.0–34.0)
RBC: 4.42 MIL/uL (ref 3.87–5.11)

## 2011-05-06 LAB — PROTIME-INR
INR: 1.2 (ref 0.00–1.49)
Prothrombin Time: 15.5 seconds — ABNORMAL HIGH (ref 11.6–15.2)

## 2011-05-06 NOTE — H&P (Signed)
NAMEAMALYA, SALMONS               ACCOUNT NO.:  [id number]  MEDICAL RECORD NO.:  [id number]  LOCATION:  3022                         FACILITY:  [facility]  PHYSICIAN:  Altha Harm, MDDATE OF BIRTH:  [birth year]  DATE OF ADMISSION:  04/30/2011 DATE OF DISCHARGE:                             HISTORY & PHYSICAL   CHIEF COMPLAINT:  Weakness on the left side.  HISTORY OF PRESENT ILLNESS:  [name] is a very lovely 77 year old female who was just recently discharged from the hospital with an unrelated illness.  The patient according to her sons was in her normal state of health until last night.  The patient went to bed and was down this morning.  Her son heard of a thud on the floor, and he found her down on the floor.  The patient was speaking nonsensically at that time and had difficulty getting her words out, and she was also complaining of weakness.  EMS was called and the patient was transported to the emergency room.  Here, she had findings consistent with a CVA.  MRI in the emergency room revealed left-sided CVA from the opercular region into the frontal lobe.  We were asked to see the patient for further evaluation and management. The patient clearly has an expressive aphasia, but she is able to understand information carefully.  With yes or no indicators, the patient states that she had dizziness prior to falling down.  She denies any chest pain.  She denies any nausea, vomiting or diarrhea.  She denies any cough or any fevers.  The patient apparently got up to go to the bathroom, and she felt weak on the right side and went to the ground.  The patient is unable to give any details beyond this, and her sons are also unable to give any further details.  PAST MEDICAL HISTORY:  Significant for the following. 1. Hypertension. 2. Chronic systolic heart failure. 3. Hyperlipidemia. 4. History of breast cancer. 5. Vertigo. 6. Osteoporosis. 7. Gout. 8. Atrial  fibrillation. 9. Anal fissure tear. 10.Recent urinary tract infection.  FAMILY HISTORY:  Significant for hypertension and heart disease in her parents.  SOCIAL HISTORY:  The patient lives with her 2 sons 1 of whom is here, Mr. Stephonie Wilcoxen who can be reached on his cell phone at [phone] and on the home phone of [phone].  There is no tobacco, alcohol or drug use in this patient.  CURRENT MEDICATIONS:  Include the following: 1. Zofran 4 mg p.o. q.6 h. p.r.n. nausea. 2. Tylenol 650 mg p.o. q.6 h. p.r.n. pain. 3. Allopurinol 100 mg p.o. daily. 4. Colchicine 0.6 mg p.o. daily. 5. Lidoderm 5% topical ointment applied t.i.d. 6. Xanax 0.5 mg p.o. b.i.d. 7. Docusate sodium 100 mg 2 caps p.o. b.i.d. 8. MiraLax 17 g q.4 h. p.r.n. 9. Norvasc 10 mg p.o. daily. 10.Pravastatin 40 mg p.o. at bedtime. 11.Nitroglycerin sublingual 0.4 mg q.5 minutes x3 doses p.r.n. chest     pain. 12.Ultram 50 mg p.o. q.6 h. p.r.n. 13.Aspirin 81 mg p.o. daily. 14.Promethazine 25 mg p.o. t.i.d. 15.Ciprofloxacin 500 mg p.o. b.i.d. 16.Diltiazem 2% gel per rectal applied q.i.d.  ALLERGIES:  CODEINE and SULFA.  PRIMARY CARE  PHYSICIAN:  Merlene Laughter. [name], MD.  STUDIES IN THE EMERGENCY ROOM:  The patient had a hemogram which shows a white blood cell count of 8.1, hemoglobin of 12.5, hematocrit of 39.2, platelet count of 207.  Sodium 138, potassium 3.8, chloride 101, bicarb 25, BUN 12, creatinine 1.09.  Urinalysis is negative for any elements consistent with a urinary tract infection.  CT head without contrast done in the emergency room shows no intracranial hemorrhage or CT evidence of large acute infarct.  MRI of the brain shows acute and subacute small-to-moderate size nonhemorrhagic infarct posterior left opercular region extending into the frontal lobe with minimal subinsular involvement.  No intracranial hemorrhage.  MRA shows branch vessel atherosclerotic type changes, ectatic intracranial  vasculature.  Review of recent echo shows an ejection fraction of 30-40% with grade 1 diastolic dysfunction.  PHYSICAL EXAMINATION:  GENERAL:  The patient is resting comfortably in the bed. VITAL SIGNS:  Temperature is 99, blood pressure 107/73, respiratory rate 18, O2 sats are 100% on room air, heart rate is 90. HEENT:  The patient is normocephalic, atraumatic.  Pupils are equally round and reactive to light.  Oropharynx is moist.  No exudate, erythema, or lesions are noted.  The patient has some mild facial drooping on the left. NECK:  Trachea is midline.  No masses.  No thyromegaly.  No JVD.  No carotid bruits.  RESPIRATORY:  The patient has a normal respiratory effort.  Equal excursion bilaterally.  No wheezing or rhonchi noted. CARDIOVASCULAR:  She has got a normal S1 and S2.  No murmurs, rubs, or gallops are noted.  PMI is nondisplaced.  No heaves or thrills on palpation. ABDOMEN:  Obese, soft, nontender, nondistended.  No masses.  No hepatosplenomegaly is noted. LYMPH NODE SURVEY:  She has got no cervical, axillary, or inguinal lymphadenopathy noted. NEUROLOGICAL:  The patient has weakness on the right upper extremity. She has an ulnar dressed also on the right upper extremity, and she is unable to participate in a strength examination at this time although her grip strength appears to be decreased bilaterally.  Lower extremities  she has got 3-/5 strength in bilateral lower extremities. DTRs are 2+ in the bilateral upper and lower extremities.  The patient is inconsistent in the sensation examination lasting mostly due to her expressive aphasia. PSYCHIATRIC:  She is alert and oriented x3 and that is about as much as I can evaluate given her expressive aphasia.  ASSESSMENT AND PLAN:  This is a patient who presents with: 1. An acute cerebrovascular accident.  The patient will have a stroke     evaluation.  We will go ahead and check her carotid duplex and ask     the  neurologist to see her.  The patient's medications will be on     hold until she passed a swallow screen, and then she will be     resumed on her oral medications as tolerated. 2. Hypertension.  The patient is presently relatively hypotensive and     thus her blood pressure medications will be held at this time.     Particularly in the light of the fact that we would not want to     lower her blood pressure too quickly in light of an embolic stroke. 3. In terms of her recent anal fissure, the patient will continue on     her rectal preparations.  Again, the MiraLAX will be held until she     is able to pass a swallow  study. 4. The patient will receive Lovenox for DVT prophylaxis and further     testing and therapeutic plan will depend upon the patient's initial     response to therapy and testing.     Altha Harm, MD     MAM/MEDQ  D:  04/30/2011  T:  05/01/2011  Job:  [id number]  cc:   Merlene Laughter. [name], M.D.  Electronically Signed by Marthann Schiller MD on 05/06/2011 01:26:54 PM

## 2011-05-08 NOTE — Discharge Summary (Signed)
[name], [name]               ACCOUNT NO.:  [id number]  MEDICAL RECORD NO.:  [id number]  LOCATION:  3022                         FACILITY:  [facility]  PHYSICIAN:  Lauren Massed, MD    DATE OF BIRTH:  [birth year]  DATE OF ADMISSION:  04/30/2011 DATE OF DISCHARGE:  05/06/2011                        DISCHARGE SUMMARY - REFERRING   PRIMARY CARE PRACTITIONER:  Lauren Bradford R. [name], MD.  PRIMARY DISCHARGE DIAGNOSES: 1. Acute/subacute nonhemorrhagic infarct of the left opercular region     extending into the frontal lobe. 2. History of atrial fibrillation, currently sinus rhythm, now started     on Coumadin this admission per recommendation of Neurology.  SECONDARY DISCHARGE DIAGNOSES: 1. Prior history of systolic heart failure and diastolic heart     failure, however, a transesophageal echocardiogram done on May 02, 2011 shows an ejection fraction around 55-60%. 2. History of hypertension. 3. History of dyslipidemia. 4. Prior history of breast cancer. 5. History of paroxysmal atrial fibrillation. 6. History of anal fissure repair. 7. History of gout.  DISCHARGE MEDICATIONS:  Include the following: 1. Coumadin 7.5 mg p.o. daily, further adjustment of the dosing will     need to be done by her physician at the skilled nursing facility. 2. Plavix 75 mg p.o. daily, further continuation of this when INR is     therapeutic, will need to be discussed with Neurology. 3. Thick-it food thickener 227 g p.o. daily p.r.n. 4. Amlodipine 5 mg 1 tablet daily. 5. Acetaminophen 325 mg 2 tablets p.o. q.6 hours p.r.n. 6. Allopurinol 100 mg p.o. daily. 7. Diltiazem 2% gel 1 application rectally 4 times a day. 8. Colace 100 mg 2 capsules p.o. twice daily. 9. Lidocaine 5% topical ointment 1 application topically 3 times a     day. 10.Zofran 4 mg 1 tablets p.o. q.6 hours p.r.n. 11.Pravastatin 40 mg 1 tab daily at bedtime. 12.MiraLax 17 g p.o. q.4 hours p.r.n.  CONSULTANTS ON THE CASE: 1.  Neurology 2. CIR.  BRIEF HISTORY OF PRESENT ILLNESS:  The patient is a very pleasant 77- year-old female, who presented to the hospital on April 30, 2011 with complaints of weakness on the left side.  Further workup in the ED confirmed an acute CVA.  She was then admitted to the Hospitalist Service.  For further details, please see the history and physical that was dictated by Dr. Doloris Jackson on admission.  PERTINENT RADIOLOGICAL STUDIES: 1. A CT of the head done on April 30, 2011, showed no intracranial     hemorrhage or CT evidence of large infarct. 2. MRI of the brain done on April 30, 2011, showed acute/subacute     small to moderate size nonhemorrhagic infarct in the posterior left     opercular region extending into the frontal lobe with minimal     subinsular involvement. 3. MRA of the brain showed a branch vessel atherosclerotic type     changes.  Ectatic intracranial vasculature. 4. Carotid Doppler done on May 01, 2011 showed no significant     extracranial carotid artery stenosis. 5. Transcranial duplex study showed absent bitemporal windows, limit     further evaluation of the anterior cerebral circulation.  Low     posterior cerebral circulation mean velocities of unclear     significance.  PROCEDURES PERFORMED:  A transesophageal echocardiogram done on May 02, 2011 showed an EF around 55-60%.  This study could not exclude a very tiny PFO.  PERTINENT LABORATORY DATA: 1. HB A1c is 5.8. 2. LDL cholesterol was 72. 3. INR on May 06, 2011 is 1.20. 4. CBC on May 06, 2011 showed a WBC of 5.5, hemoglobin of 12.1,     and a platelet count of a 191.  BRIEF HOSPITAL COURSE: 1. Acute CVA.  As noted above, the patient presented with dysarthria     and left-sided weakness.  A CT of the head and MRI of the brain     results are noted above.  Since she was already on aspirin, she was     converted to Plavix.  Neurology was consulted.  Given history of      prior atrial fibrillation, a TEE was recommended.  This was done,     which did not show any intracardiac thrombus.  However, there was a     question of a very tiny PFO.  Subsequently, Neurology did recommend     the patient starting on Coumadin and this was done.  Her INR is     currently subtherapeutic and her dosing of Coumadin has been     increased to 7.5 mg p.o. daily.  She will continue to need Coumadin     dose adjustment and INR monitoring at her skilled nursing facility.     We will leave this to her MD at the facility.  She is continued to     be maintained on Plavix, further decision whether to continue     Plavix long term will need to be made by her MD at the facility as     well.  She is being resumed on her statin on discharge as well. 2. Atrial fibrillation.  The patient does apparently have a history of     atrial fibrillation, and given her stroke, she has been started on     Coumadin.  For further details, please see discussion above.     Please also note that this patient does not have any obvious     contraindications to Coumadin.  She does not have a history of     frequent falls, or has had any significant GI bleeding in the past. 3. History of CHF per her prior records.  The patient apparently has a     history of chronic systolic heart failure.  However, a     transesophageal echocardiogram showed her to have a normal EF.  In     any event, she appears compensated on this. 4. Hypertension.  She has been resumed on low dosing of amlodipine on     discharge.  Further optimization of this regimen will need to be     done by her MD at her skilled nursing facility. 5. Recent history of anal fissure repair.  The patient has been     resumed on lidocaine and diltiazem ointment.  She has also been     resumed on MiraLax.  This issue is stable. 6. Disposition.  The patient to be transferred to skilled nursing     facility once a bed is available. 7. Diet  recommendations.  Dysphagia 2 diet with soft thin liquids. 8. Follow up instructions upon discharge from her skilled nursing     facility.  She will need to follow up with her primary care     practitioner, Dr. Andi Jackson in 1 or 2 weeks. 9. The patient will need frequent INR monitoring and dose adjustment     of her Coumadin when she is at the skilled nursing facility.  We     will leave that to the discretion of her primary MD at the     facility.  Total time spent for discharge equals 45 minutes.     Lauren Massed, MD     SG/MEDQ  D:  05/06/2011  T:  05/06/2011  Job:  [id number]  cc:   Merlene Laughter. Renae Jackson, M.D. Pramod P. Pearlean Brownie, MD  Electronically Signed by [name]  on 05/08/2011 12:35:29 PM

## 2011-05-09 NOTE — Consult Note (Signed)
[name], Jackson               ACCOUNT NO.:  [id number]  MEDICAL RECORD NO.:  [id number]  LOCATION:  3022                         FACILITY:  [facility]  PHYSICIAN:  Marlan Palau, M.D.  DATE OF BIRTH:  1927/05/10  DATE OF CONSULTATION: DATE OF DISCHARGE:                                CONSULTATION   HISTORY OF PRESENT ILLNESS:  [name] is an 77 year old black female, born on 1927/04/27, with a history of congestive heart failure, atrial fibrillation, hypertension and dyslipidemia.  This patient was just recently in the hospital with rectal pain and found to have a anal fissure.  The patient was discharged on April 27, 2011. The patient was last seen normal the day prior to admission.  The patient was found in the morning of admission with a right-sided weakness and aphasia.  The patient was brought in for an evaluation. MRI scan of the brain confirms an acute stroke in the left opercular area with possible branch occlusion off of the left middle cerebral artery.  The patient already had a recent 2-D echocardiogram that shows evidence of an ejection fraction of 35%-40% with abnormal left ventricular relaxation.  The patient had been on low-dose aspirin prior to coming in.  EKG on this admission shows evidence of ectopic atrial rhythm.  Neurology was asked to see the patient for further evaluation. NIH stroke scale score was 6.  The patient is not a t-PA candidate due to duration of symptoms.  PAST MEDICAL HISTORY:  Significant for: 1. 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 2. Congestive heart failure. 3. Hypertension. 4. Atrial fibrillation. 5. History of breast cancer, status post mastectomy. 6. Gout. 7. Dyslipidemia. 8. Vertigo. 9. Appendectomy. 10.Hemorrhoid surgery. 11.Bilateral cataract surgery. 12.Dyslipidemia. 13.History of anal fissure.  ALLERGIES:  The patient has allergy to CODEINE and SULFA drugs, which cause nausea.  Does not smoke or  drink.  SOCIAL HISTORY:  This patient lives in the [place] area.  The patient lives with her family, ambulates with a walker, and is on home oxygen therapy.  FAMILY MEDICAL HISTORY:  As per past records, notable for coronary artery disease, cancer.  Her father died with pulmonary tuberculosis and the mother had a congestive heart failure.  The patient has a brother with cancer of the colon.  Sister with coronary artery disease.  REVIEW OF SYSTEMS:  Difficult to obtain with the patient has noted onset of problems with weakness on the right side, speech problems.  Denies headache, visual complaints.  The patient has not had any numbness of the extremities.  Denies any chest pain, shortness of breath, or abdominal pain.  PHYSICAL EXAMINATION:  VITAL SIGNS:  Blood pressure currently is 135/78, heart rate 85, respiratory rate 17, temperature afebrile. GENERAL:  This patient is a fairly well-developed black female who is alert and cooperative at the time of the examination. HEENT:  Pupils are postsurgical reactive. NECK:  Supple.  No carotid bruits noted. RESPIRATORY:  Clear. CARDIOVASCULAR:  Regular rate and rhythm.  No obvious murmurs or rubs noted. EXTREMITIES:  Without significant edema. NEUROLOGIC:  Cranial nerves are as above.  The patient has asymmetric smile, decrease in  the right nasolabial fold.  Extraocular movements are full.  Visual fields are intact to threat with blinks to threat bilaterally.  The patient has good pinprick sensation in the face bilaterally.  The patient has good pinprick, soft touch, and vibratory sensation throughout.  Has some drift with the right arm and right leg, but is able lift the right arm and right leg up off the bed.  Good strength on the left side.  The patient has no evidence of ataxia with finger-nose-finger, may have some apraxia of the right arm.  The patient was not ambulated.  The patient has symmetric reflexes.   Toes are neutral bilaterally.  NIH stroke scale score is 6.  LABORATORY VALUES:  Notable for white count of 8.1, hemoglobin of 12.5, hematocrit of 39.2, MCV of 82, platelets of 207.  INR 1.05.  Sodium 135, potassium 3.8, chloride of 101, CO2 of 25, glucose of 98, BUN of 12, creatinine 1.09.  Total bili of 0.3, alkaline phosphatase of 81, SGOT of 30, SGPT of 19, total protein 7.3, albumin of 3.3, calcium 9.8.  CK of 97, MB fraction 3.3, troponin-I less than 0.3.  Urinalysis reveals specific gravity 1.017, pH 5.0, 0-2 red cells and white cells.  MRI scan is as above.  IMPRESSION: 1. 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 2. History of atrial fibrillation. 3. Hypertension. 4. Dyslipidemia. The patient does have risk factors for stroke.  The patient has had a recent 2-D echocardiogram.  The patient will go for carotid Doppler study, have fasting lipid profile.  Physical and Occupational, Speech Therapy need to see and evaluate her.  The patient will be maintained on anti-platelet agents.  She may go to full dose of aspirin or Plavix prior to going home.  We will follow the patient's clinical course while in-house.  Again, the patient was not a t-PA candidate secondary to duration of symptoms.    Marlan Palau, M.D.    CKW/MEDQ  D:  04/30/2011  T:  05/01/2011  Job:  [id number]  cc:   [facility]  Electronically Signed by Thana Farr M.D. on 05/09/2011 09:30:34 AM

## 2011-06-20 ENCOUNTER — Other Ambulatory Visit: Payer: Self-pay | Admitting: Internal Medicine

## 2011-06-20 DIAGNOSIS — Z1231 Encounter for screening mammogram for malignant neoplasm of breast: Secondary | ICD-10-CM

## 2011-07-25 ENCOUNTER — Ambulatory Visit
Admission: RE | Admit: 2011-07-25 | Discharge: 2011-07-25 | Disposition: A | Discharge status: Home / Self Care | Payer: Medicare Other | Source: Ambulatory Visit | Attending: Internal Medicine | Admitting: Internal Medicine

## 2011-07-25 DIAGNOSIS — Z1231 Encounter for screening mammogram for malignant neoplasm of breast: Secondary | ICD-10-CM

## 2011-08-07 ENCOUNTER — Emergency Department (HOSPITAL_COMMUNITY): Payer: Medicare Other

## 2011-08-07 ENCOUNTER — Encounter (HOSPITAL_COMMUNITY): Payer: Self-pay | Admitting: *Deleted

## 2011-08-07 ENCOUNTER — Emergency Department (HOSPITAL_COMMUNITY)
Admission: EM | Admit: 2011-08-07 | Discharge: 2011-08-07 | Disposition: A | Discharge status: Home / Self Care | Payer: Medicare Other | Attending: Emergency Medicine | Admitting: Emergency Medicine

## 2011-08-07 DIAGNOSIS — K529 Noninfective gastroenteritis and colitis, unspecified: Secondary | ICD-10-CM

## 2011-08-07 DIAGNOSIS — I10 Essential (primary) hypertension: Secondary | ICD-10-CM | POA: Insufficient documentation

## 2011-08-07 DIAGNOSIS — I509 Heart failure, unspecified: Secondary | ICD-10-CM | POA: Insufficient documentation

## 2011-08-07 DIAGNOSIS — R1110 Vomiting, unspecified: Secondary | ICD-10-CM | POA: Insufficient documentation

## 2011-08-07 DIAGNOSIS — R197 Diarrhea, unspecified: Secondary | ICD-10-CM | POA: Insufficient documentation

## 2011-08-07 DIAGNOSIS — K5289 Other specified noninfective gastroenteritis and colitis: Secondary | ICD-10-CM | POA: Insufficient documentation

## 2011-08-07 LAB — URINALYSIS, ROUTINE W REFLEX MICROSCOPIC
Bilirubin Urine: NEGATIVE
Hgb urine dipstick: NEGATIVE
Ketones, ur: NEGATIVE mg/dL
Nitrite: NEGATIVE
Specific Gravity, Urine: 1.004 — ABNORMAL LOW (ref 1.005–1.030)
Urobilinogen, UA: 0.2 mg/dL (ref 0.0–1.0)

## 2011-08-07 LAB — COMPREHENSIVE METABOLIC PANEL
ALT: 17 U/L (ref 0–35)
Alkaline Phosphatase: 119 U/L — ABNORMAL HIGH (ref 39–117)
BUN: 11 mg/dL (ref 6–23)
CO2: 26 mEq/L (ref 19–32)
Chloride: 98 mEq/L (ref 96–112)
GFR calc Af Amer: 90 mL/min — ABNORMAL LOW (ref >90)
Glucose, Bld: 99 mg/dL (ref 70–99)
Potassium: 4.1 mEq/L (ref 3.5–5.1)
Sodium: 137 mEq/L (ref 135–145)
Total Bilirubin: 0.4 mg/dL (ref 0.3–1.2)
Total Protein: 8.7 g/dL — ABNORMAL HIGH (ref 6.0–8.3)

## 2011-08-07 LAB — CBC
Hemoglobin: 15.1 g/dL — ABNORMAL HIGH (ref 12.0–15.0)
MCH: 27.4 pg (ref 26.0–34.0)
Platelets: 189 10*3/uL (ref 150–400)
RBC: 5.52 MIL/uL — ABNORMAL HIGH (ref 3.87–5.11)
WBC: 3.9 10*3/uL — ABNORMAL LOW (ref 4.0–10.5)

## 2011-08-07 LAB — PROTIME-INR
INR: 1.09 (ref 0.00–1.49)
Prothrombin Time: 14.3 seconds (ref 11.6–15.2)

## 2011-08-07 LAB — DIFFERENTIAL
Lymphocytes Relative: 28 % (ref 12–46)
Lymphs Abs: 1.1 10*3/uL (ref 0.7–4.0)
Monocytes Relative: 13 % — ABNORMAL HIGH (ref 3–12)
Neutro Abs: 2.2 10*3/uL (ref 1.7–7.7)
Neutrophils Relative %: 56 % (ref 43–77)

## 2011-08-07 LAB — LACTIC ACID, PLASMA: Lactic Acid, Venous: 1.7 mmol/L (ref 0.5–2.2)

## 2011-08-07 LAB — URINE MICROSCOPIC-ADD ON

## 2011-08-07 LAB — LIPASE, BLOOD: Lipase: 33 U/L (ref 11–59)

## 2011-08-07 MED ORDER — SODIUM CHLORIDE 0.9 % IV BOLUS (SEPSIS)
500.0000 mL | Freq: Once | INTRAVENOUS | Status: AC
  Administered 2011-08-07: 500 mL via INTRAVENOUS

## 2011-08-07 MED ORDER — ONDANSETRON HCL 4 MG/2ML IJ SOLN
4.0000 mg | Freq: Once | INTRAMUSCULAR | Status: AC
  Administered 2011-08-07: 4 mg via INTRAVENOUS
  Filled 2011-08-07: qty 2

## 2011-08-07 MED ORDER — ONDANSETRON HCL 4 MG PO TABS: 4.0000 mg | ORAL_TABLET | Freq: Four times a day (QID) | ORAL | Status: AC

## 2011-08-07 NOTE — ED Provider Notes (Signed)
History     CSN: [id number]  Arrival date & time 08/07/11  1019   First MD Initiated Contact with Patient 08/07/11 1100      Chief Complaint  Patient presents with  . Emesis  . Diarrhea    (Consider location/radiation/quality/duration/timing/severity/associated sxs/prior treatment) HPI Comments: Denies diarrhea stating that she must take laxatives for constipation.  Lives with her son.  Denies urinary sx or abd pain  Patient is a 78 y.o. female presenting with vomiting. The history is provided by the patient. No language interpreter was used.  Emesis  This is a new problem. The current episode started 6 to 12 hours ago. The problem occurs continuously. The problem has been gradually worsening. The emesis has an appearance of stomach contents. There has been no fever. Pertinent negatives include no abdominal pain, no arthralgias, no chills, no cough, no diarrhea, no fever, no headaches and no myalgias.    Past Medical History  Diagnosis Date  . Hypertension   . CHF (congestive heart failure)   . Cancer     History reviewed. No pertinent past surgical history.  History reviewed. No pertinent family history.  History  Substance Use Topics  . Smoking status: Not on file  . Smokeless tobacco: Not on file  . Alcohol Use: No    OB History    Grav Para Term Preterm Abortions TAB SAB Ect Mult Living                  Review of Systems  Constitutional: Positive for appetite change. Negative for fever, chills and activity change.  HENT: Negative for congestion, sore throat, rhinorrhea, neck pain and neck stiffness.   Respiratory: Negative for cough and shortness of breath.   Cardiovascular: Negative for chest pain, palpitations and leg swelling.  Gastrointestinal: Negative for abdominal pain, diarrhea and blood in stool.  Genitourinary: Negative for flank pain.  Musculoskeletal: Negative for myalgias and arthralgias.  Neurological: Negative for light-headedness, numbness  and headaches.  All other systems reviewed and are negative.    Allergies  Codeine and Sulfa antibiotics  Home Medications   Current Outpatient Rx  Name Route Sig Dispense Refill  . ACETAMINOPHEN 325 MG PO TABS Oral Take 650 mg by mouth every 6 (six) hours as needed. pain    . ALLOPURINOL 100 MG PO TABS Oral Take 100 mg by mouth daily.    Marland Kitchen AMLODIPINE BESYLATE 5 MG PO TABS Oral Take 5 mg by mouth daily.    Marland Kitchen CLOPIDOGREL BISULFATE 75 MG PO TABS Oral Take 75 mg by mouth daily.    Marland Kitchen DOCUSATE SODIUM 100 MG PO CAPS Oral Take 200 mg by mouth 2 (two) times daily.    Marland Kitchen LIDOCAINE 5 % EX OINT Topical Apply 1 application topically 3 (three) times daily.    Marland Kitchen ONDANSETRON HCL 4 MG PO TABS Oral Take 4 mg by mouth every 6 (six) hours as needed. nausea    . POLYETHYLENE GLYCOL 3350 PO PACK Oral Take 17 g by mouth daily.    Marland Kitchen PRAVASTATIN SODIUM 40 MG PO TABS Oral Take 40 mg by mouth daily.    . TRAMADOL HCL 50 MG PO TABS Oral Take 50 mg by mouth every 6 (six) hours as needed. pain    . WARFARIN SODIUM 5 MG PO TABS Oral Take 5 mg by mouth daily.    Marland Kitchen ONDANSETRON HCL 4 MG PO TABS Oral Take 1 tablet (4 mg total) by mouth every 6 (six) hours. 12 tablet  0    BP 120/82  Pulse 85  Temp(Src) 97.9 F (36.6 C) (Oral)  Resp 16  SpO2 92%  Physical Exam  Nursing note and vitals reviewed. Constitutional: She is oriented to person, place, and time. She appears well-developed and well-nourished. No distress.  HENT:  Head: Normocephalic and atraumatic.  Mouth/Throat: Oropharynx is clear and moist. No oropharyngeal exudate.  Eyes: Conjunctivae and EOM are normal. Pupils are equal, round, and reactive to light.  Neck: Normal range of motion. Neck supple.  Cardiovascular: Normal rate, regular rhythm, normal heart sounds and intact distal pulses.  Exam reveals no gallop and no friction rub.   No murmur heard. Pulmonary/Chest: Effort normal and breath sounds normal. No respiratory distress.  Abdominal: Soft.  Bowel sounds are normal. There is no tenderness.  Musculoskeletal: Normal range of motion. She exhibits no edema and no tenderness.  Neurological: She is alert and oriented to person, place, and time.  Skin: Skin is warm and dry. No rash noted.    ED Course  Procedures (including critical care time)  Labs Reviewed  CBC - Abnormal; Notable for the following:    WBC 3.9 (*)    RBC 5.52 (*)    Hemoglobin 15.1 (*)    All other components within normal limits  DIFFERENTIAL - Abnormal; Notable for the following:    Monocytes Relative 13 (*)    All other components within normal limits  COMPREHENSIVE METABOLIC PANEL - Abnormal; Notable for the following:    Calcium 10.8 (*)    Total Protein 8.7 (*)    Alkaline Phosphatase 119 (*)    GFR calc non Af Amer 77 (*)    GFR calc Af Amer 90 (*)    All other components within normal limits  URINALYSIS, ROUTINE W REFLEX MICROSCOPIC - Abnormal; Notable for the following:    Specific Gravity, Urine 1.004 (*)    Leukocytes, UA MODERATE (*)    All other components within normal limits  PROTIME-INR  LIPASE, BLOOD  LACTIC ACID, PLASMA  URINE MICROSCOPIC-ADD ON   Dg Abd 1 View  08/07/2011  *RADIOLOGY REPORT*  Clinical Data: Nausea, diarrhea, vomiting for 1 day.  ABDOMEN - 1 VIEW  Comparison: Abdomen pelvis with contrast 04/23/2011.  Findings: Cholecystectomy clips.  The bowel gas pattern is nonobstructive.  No significant stool burden.  No radiopaque urinary tract calculus is identified.  Sclerosis about the pubic symphysis, suggesting osteitis pubis, unchanged from prior CT of October 2012.  IMPRESSION: Nonobstructive bowel gas pattern.  Original Report Authenticated By: Britta Mccreedy, M.D.     1. Gastroenteritis       MDM  Patient received 500 cc IV fluid bolus. Received Zofran. Laboratory studies and urinalysis is unremarkable. She's able tolerate oral intake without difficulty. She'll be discharged home with a prescription for Zofran  instructed to followup with her primary care physician. I feel this is likely secondary to gastroenteritis. She had a nonobstructive bowel gas pattern and had no abdominal tenderness.        Dayton Bailiff, MD 08/07/11 1435

## 2011-08-07 NOTE — ED Notes (Signed)
Patient assisted to bathroom wheelchair.  Without incident.  Patient sitting in wheelchair next to son given water and crackers.

## 2011-08-07 NOTE — ED Notes (Signed)
Reports onset of n/v/d yesterday.

## 2011-08-07 NOTE — ED Notes (Signed)
Patient ate 3 packages of crackers, apple juice, water without incident. Resting comfortably in wheelchair with son sitting in chair next to patient.

## 2011-08-07 NOTE — ED Notes (Signed)
Onset one day ago nausea and vomiting continued today. States history of diarrhea takes laxative for constipation.  Patient denies any pain states slight shortness of breath on exertion. Airway intact bilateral equal chest rise and fall.   Abdomen soft moderate distended.  Bowel sound present in all fields.  Patient lives with son who is at bedside,

## 2012-07-15 ENCOUNTER — Other Ambulatory Visit: Payer: Self-pay | Admitting: Internal Medicine

## 2012-07-15 DIAGNOSIS — Z9882 Breast implant status: Secondary | ICD-10-CM

## 2012-07-15 DIAGNOSIS — Z1231 Encounter for screening mammogram for malignant neoplasm of breast: Secondary | ICD-10-CM

## 2012-07-15 DIAGNOSIS — Z9011 Acquired absence of right breast and nipple: Secondary | ICD-10-CM

## 2012-08-04 ENCOUNTER — Other Ambulatory Visit: Payer: Self-pay | Admitting: Internal Medicine

## 2012-08-04 ENCOUNTER — Ambulatory Visit
Admission: RE | Admit: 2012-08-04 | Discharge: 2012-08-04 | Disposition: A | Discharge status: Home / Self Care | Payer: Medicare Other | Source: Ambulatory Visit | Attending: Internal Medicine | Admitting: Internal Medicine

## 2012-08-04 DIAGNOSIS — Z9011 Acquired absence of right breast and nipple: Secondary | ICD-10-CM

## 2012-08-04 DIAGNOSIS — Z1231 Encounter for screening mammogram for malignant neoplasm of breast: Secondary | ICD-10-CM

## 2012-08-04 DIAGNOSIS — Z9882 Breast implant status: Secondary | ICD-10-CM

## 2012-08-05 ENCOUNTER — Other Ambulatory Visit: Payer: Self-pay | Admitting: Internal Medicine

## 2012-08-05 DIAGNOSIS — R928 Other abnormal and inconclusive findings on diagnostic imaging of breast: Secondary | ICD-10-CM

## 2012-08-18 ENCOUNTER — Ambulatory Visit
Admission: RE | Admit: 2012-08-18 | Discharge: 2012-08-18 | Disposition: A | Discharge status: Home / Self Care | Payer: Medicare Other | Source: Ambulatory Visit | Attending: Internal Medicine | Admitting: Internal Medicine

## 2012-08-18 DIAGNOSIS — R928 Other abnormal and inconclusive findings on diagnostic imaging of breast: Secondary | ICD-10-CM

## 2012-12-14 ENCOUNTER — Emergency Department (HOSPITAL_COMMUNITY)
Admission: EM | Admit: 2012-12-14 | Discharge: 2012-12-14 | Disposition: A | Discharge status: Home / Self Care | Payer: Medicare Other | Attending: Emergency Medicine | Admitting: Emergency Medicine

## 2012-12-14 ENCOUNTER — Emergency Department (HOSPITAL_COMMUNITY): Payer: Medicare Other

## 2012-12-14 ENCOUNTER — Encounter (HOSPITAL_COMMUNITY): Payer: Self-pay | Admitting: *Deleted

## 2012-12-14 DIAGNOSIS — Z880 Allergy status to penicillin: Secondary | ICD-10-CM | POA: Insufficient documentation

## 2012-12-14 DIAGNOSIS — R0602 Shortness of breath: Secondary | ICD-10-CM | POA: Insufficient documentation

## 2012-12-14 DIAGNOSIS — M2550 Pain in unspecified joint: Secondary | ICD-10-CM | POA: Insufficient documentation

## 2012-12-14 DIAGNOSIS — I10 Essential (primary) hypertension: Secondary | ICD-10-CM | POA: Insufficient documentation

## 2012-12-14 DIAGNOSIS — M25519 Pain in unspecified shoulder: Secondary | ICD-10-CM | POA: Insufficient documentation

## 2012-12-14 DIAGNOSIS — I509 Heart failure, unspecified: Secondary | ICD-10-CM | POA: Insufficient documentation

## 2012-12-14 DIAGNOSIS — Z7982 Long term (current) use of aspirin: Secondary | ICD-10-CM | POA: Insufficient documentation

## 2012-12-14 DIAGNOSIS — Z853 Personal history of malignant neoplasm of breast: Secondary | ICD-10-CM | POA: Insufficient documentation

## 2012-12-14 DIAGNOSIS — Z8673 Personal history of transient ischemic attack (TIA), and cerebral infarction without residual deficits: Secondary | ICD-10-CM | POA: Insufficient documentation

## 2012-12-14 DIAGNOSIS — M25512 Pain in left shoulder: Secondary | ICD-10-CM

## 2012-12-14 DIAGNOSIS — Z79899 Other long term (current) drug therapy: Secondary | ICD-10-CM | POA: Insufficient documentation

## 2012-12-14 MED ORDER — OXYCODONE-ACETAMINOPHEN 5-325 MG PO TABS
2.0000 | ORAL_TABLET | Freq: Once | ORAL | Status: AC
  Administered 2012-12-14: 2 via ORAL
  Filled 2012-12-14: qty 2

## 2012-12-14 NOTE — ED Notes (Signed)
Return from xray

## 2012-12-14 NOTE — ED Provider Notes (Signed)
  I performed a history and physical examination of [name] and discussed her management with Ms. Humes.  I agree with the history, physical, assessment, and plan of care, with the following exceptions: None  I was present for the following procedures: None Time Spent in Critical Care of the patient: None Time spent in discussions with the patient and family: 8  Demeka Sutter S    Hilario Quarry, MD 12/14/12 1820

## 2012-12-14 NOTE — ED Notes (Signed)
Reports pain much worse with mvmt. Decreased ROM due to pain. Denies injury, no obvious deformty.

## 2012-12-14 NOTE — ED Notes (Signed)
Waiting for arm sling.

## 2012-12-14 NOTE — ED Notes (Signed)
C/o left shoulder pain x 1 month, denies injury

## 2012-12-14 NOTE — ED Provider Notes (Signed)
History     CSN: [id number]  Arrival date & time 12/14/12  4098   First MD Initiated Contact with Patient 12/14/12 [phone]      Chief Complaint  Patient presents with  . Shoulder Pain    (Consider location/radiation/quality/duration/timing/severity/associated sxs/prior treatment) HPI Comments: Patient is an 79 y/o female with hx of HTN, A fib, remote L cerebellar infarct in 2012, CHF with systolic dysfunction, and breast CA who presents for L shoulder pain x 1 month, worsening since last night. Patient states the pain is localized to her L shoulder and is "deep and throbbing" in nature. Patient states pain was intermittent and relieved with topical rubbing alcohol and shoulder exercises up until last night. Pain has been constant since last night without alleviating factors; aggravated with palpation and movement. Patient admits to mild SOB with pain. She denies recent falls or trauma/injury to her L shoulder as well as fever, CP, N/V, and numbness or weakness in her LUE. Patient lives at home with her son. On Plavix for A fib. PCP - Dr. Andi Devon.   The history is provided by the patient. No language interpreter was used.    Past Medical History  Diagnosis Date  . Hypertension   . CHF (congestive heart failure)   . Cancer   . Stroke     Past Surgical History  Procedure Laterality Date  . Abdominal hysterectomy    . Cholecystectomy    . Mastectomy      right  . Hemorroidectomy      No family history on file.  History  Substance Use Topics  . Smoking status: Not on file  . Smokeless tobacco: Not on file  . Alcohol Use: No    OB History   Grav Para Term Preterm Abortions TAB SAB Ect Mult Living                  Review of Systems  Constitutional: Negative for fever.  Respiratory: Positive for shortness of breath (mild).   Cardiovascular: Negative for chest pain.  Gastrointestinal: Negative for nausea and vomiting.  Musculoskeletal: Positive for arthralgias.   Skin: Negative for wound.  Neurological: Negative for weakness and numbness.  All other systems reviewed and are negative.    Allergies  Codeine; Penicillins; and Sulfa antibiotics  Home Medications   Current Outpatient Rx  Name  Route  Sig  Dispense  Refill  . allopurinol (ZYLOPRIM) 100 MG tablet   Oral   Take 100 mg by mouth daily.         Marland Kitchen amLODipine (NORVASC) 10 MG tablet   Oral   Take 10 mg by mouth daily.         Marland Kitchen aspirin EC 81 MG tablet   Oral   Take 81 mg by mouth daily.         . cholecalciferol (VITAMIN D) 1000 UNITS tablet   Oral   Take 1,000 Units by mouth 2 (two) times daily.         . clopidogrel (PLAVIX) 75 MG tablet   Oral   Take 75 mg by mouth daily.         Marland Kitchen docusate sodium (COLACE) 100 MG capsule   Oral   Take 200 mg by mouth 2 (two) times daily.         . hydrochlorothiazide (MICROZIDE) 12.5 MG capsule   Oral   Take 12.5 mg by mouth daily as needed (for swelling).         Marland Kitchen  metoprolol succinate (TOPROL-XL) 25 MG 24 hr tablet   Oral   Take 25 mg by mouth daily.         Marland Kitchen omeprazole (PRILOSEC) 20 MG capsule   Oral   Take 20 mg by mouth daily.         . pravastatin (PRAVACHOL) 40 MG tablet   Oral   Take 40 mg by mouth daily.         Marland Kitchen Propylene Glycol (SYSTANE BALANCE OP)   Ophthalmic   Apply 1 drop to eye daily.           BP 148/123  Pulse 121  Temp(Src) 99.1 F (37.3 C) (Oral)  Resp 19  Ht 5' (1.524 m)  Wt 124 lb (56.246 kg)  BMI 24.22 kg/m2  SpO2 100%  Physical Exam  Nursing note and vitals reviewed. Constitutional: She is oriented to person, place, and time. She appears well-developed and well-nourished.  Patient speaks in goal oriented sentences and answers questions appropriately  HENT:  Head: Normocephalic and atraumatic.  Mouth/Throat: Oropharynx is clear and moist. No oropharyngeal exudate.  Eyes: Conjunctivae are normal. Pupils are equal, round, and reactive to light. No scleral icterus.   Neck: Normal range of motion. Neck supple.  Cardiovascular: Intact distal pulses.   Irregularly irregular rhythm; regular rate. Distal radial pulses 2+ b/l  Pulmonary/Chest: Effort normal and breath sounds normal. No respiratory distress. She has no wheezes. She has no rales.  Abdominal: Soft. She exhibits no distension. There is no tenderness. There is no rebound.  Musculoskeletal:       Left shoulder: She exhibits decreased range of motion, tenderness, bony tenderness and pain (with external rotation). She exhibits no swelling, no effusion, no crepitus, no deformity, no laceration, no spasm, normal pulse and normal strength.  Neurological: She is alert and oriented to person, place, and time.  Sensation to light touch intact and 5/5 strength against resistance in b/l upper extremities.  Skin: Skin is warm and dry. No rash noted. No erythema. No pallor.  Psychiatric: She has a normal mood and affect. Her behavior is normal.    ED Course  Procedures (including critical care time)  Labs Reviewed - No data to display Dg Chest 2 View  12/14/2012   *RADIOLOGY REPORT*  Clinical Data: Shortness of breath  CHEST - 2 VIEW  Comparison: 07/18/2010  Findings: The cardiac shadow remains mildly enlarged.  The lungs are clear bilaterally.  No acute bony abnormality is seen. Postsurgical changes are again noted in the right axilla.  IMPRESSION: No acute abnormalities seen.   Original Report Authenticated By: Alcide Clever, M.D.   Dg Shoulder Left  12/14/2012   *RADIOLOGY REPORT*  Clinical Data: Left shoulder pain  LEFT SHOULDER - 2+ VIEW  Comparison: None.  Findings: Mild degenerative changes of the acromioclavicular joint are seen.  No acute fracture or dislocation is noted.  No gross soft tissue abnormality is seen.  IMPRESSION: No acute abnormality noted.   Original Report Authenticated By: Alcide Clever, M.D.     Date: 12/14/2012  Rate: 91  Rhythm: sinus arrhythmia  QRS Axis: left  Intervals: normal   ST/T Wave abnormalities: nonspecific ST/T changes  Conduction Disutrbances:first-degree A-V block   Narrative Interpretation: sinus arrythmia with 1st degree AV block; no STEMI and unchanged from prior.  Old EKG Reviewed: unchanged from 04/30/2011    1. Shoulder pain, acute, left      MDM  Patient is an 79 year old female with an extensive past medical history  who presents for left shoulder pain x1 month, worsening yesterday evening. Pain worsened with movement and external rotation and reproducible on palpation of the left shoulder. No visible deformity or decreased sensation or muscle strength in b/l upper extremities. W/u to include DG L shoulder and CXR, given mild SOB. Percocet ordered for pain control.  Patient shoulder x-ray negative for fracture, dislocation, or other bony abnormality by my interpretation. Chest x-ray also negative for evidence of pneumonia, pleural effusion, pneumothorax, or infiltrate by my interpretation. Mild cardiomegaly appreciated, unchanged from prior on 07/18/2010. Patient will be put in shoulder immobilizer for support and symptomatic relief and given orthopedic follow up for further evaluation of symptoms. Tylenol advised for discomfort. Patient has good support at home, as she lives with her son, and also has close PCP follow up. Patient also seen and examined by Dr. Rosalia Hammers who is in agreement with work up and management plan. Indications for ED return provided. Patient states comfort and understanding with this discharge plan with no unaddressed concerns.      Antony Madura, PA-C 12/14/12 1055

## 2013-01-31 IMAGING — CT CT ABD-PELV W/ CM
3 of 5 series · 14 of 32 positions shown, 19 images · IV contrast (water/omni  & 80ml omni 300)
Comparison: None.

CLINICAL DATA: Rectal pain; history of breast cancer; hemorrhoids;
previous appendectomy and cholecystectomy and hysterectomy

CT ABDOMEN AND PELVIS WITH CONTRAST
TECHNIQUE: Multidetector CT imaging of the abdomen and pelvis was
performed following the standard protocol during bolus
administration of intravenous contrast.
Contrast:  80 ml Omni 300

[Series 2: routine abdomen · axial · 0.76mm/px · z∈[-364,-119]mm · 4 of 83 slices shown, 9 images]
[im 17/83  soft-tissue]
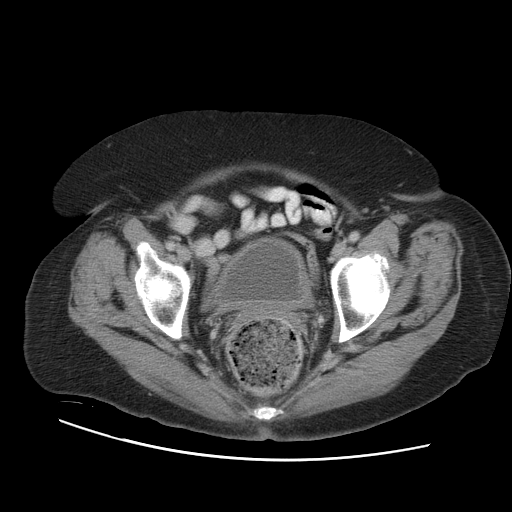
[im 17/83  lung]
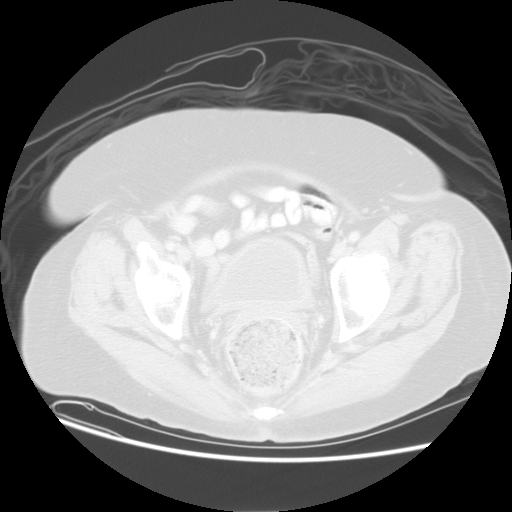
[im 17/83  bone]
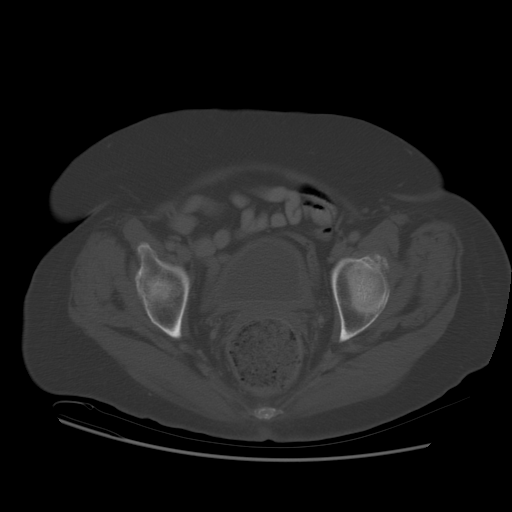
[im 33/83  soft-tissue]
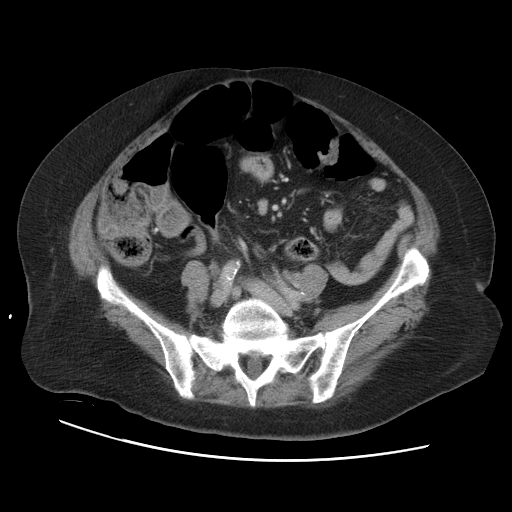
[im 33/83  lung]
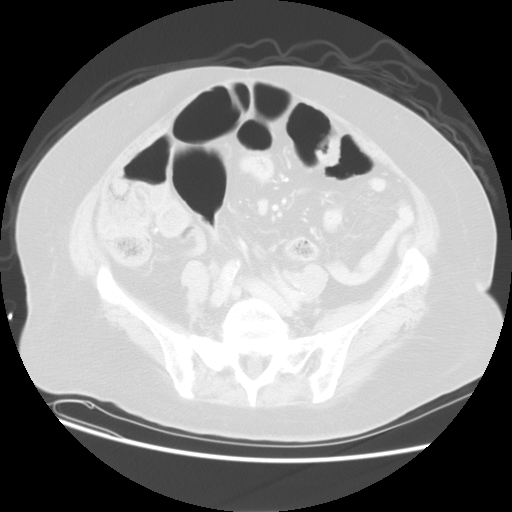
[im 50/83  soft-tissue]
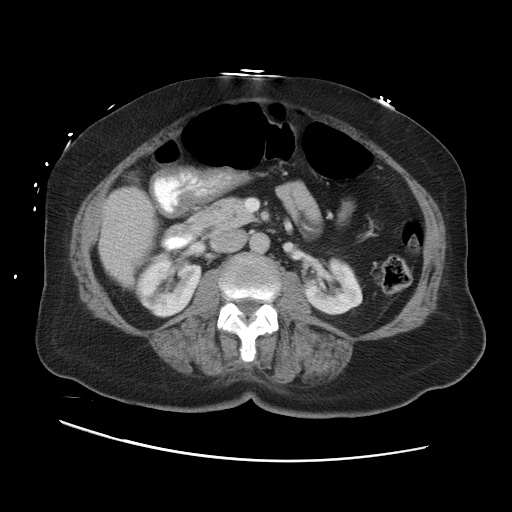
[im 50/83  lung]
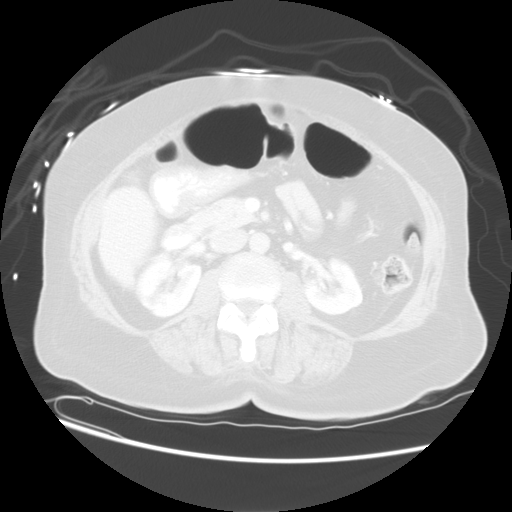
[im 66/83  soft-tissue]
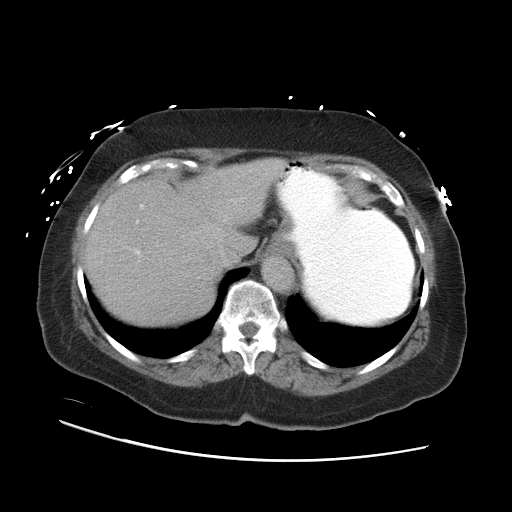
[im 66/83  lung]
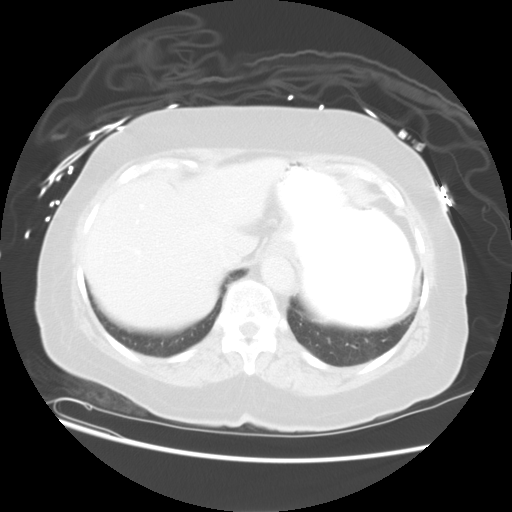

[Series 400: coronals · coronal · 0.86mm/px · 2 of 149 slices shown]
[im 17/149  soft-tissue]
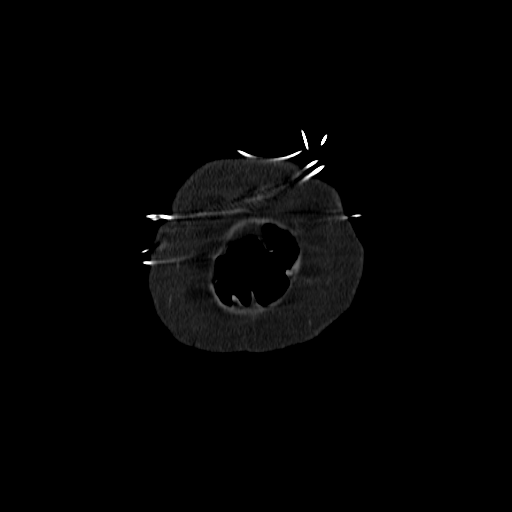
[im 33/149  soft-tissue]
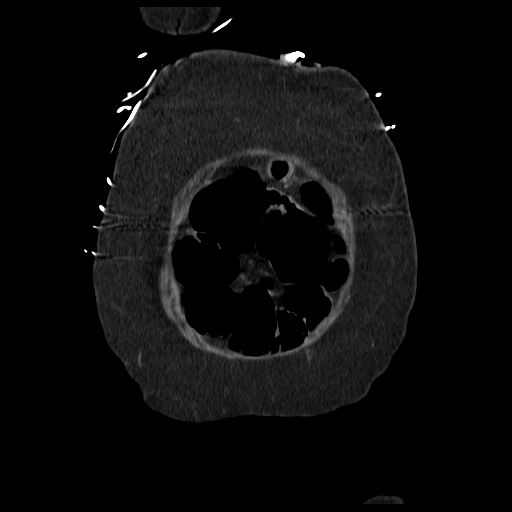

[Series 401: sagittals · sagittal · 0.86mm/px · 8 of 180 slices shown]
[im 17/180  soft-tissue]
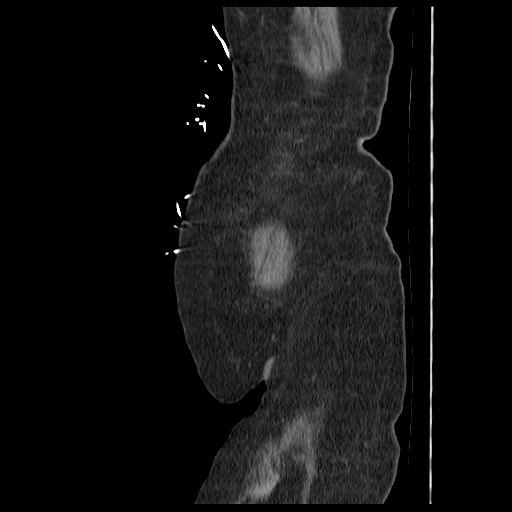
[im 33/180  soft-tissue]
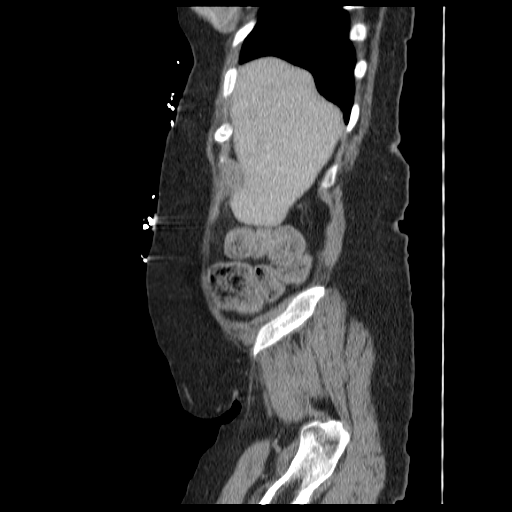
[im 66/180  soft-tissue]
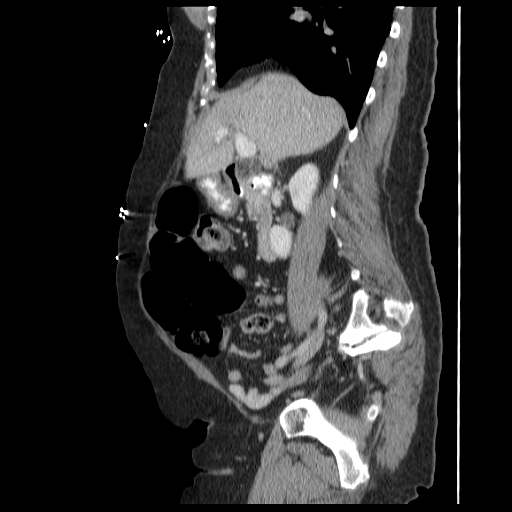
[im 82/180  soft-tissue]
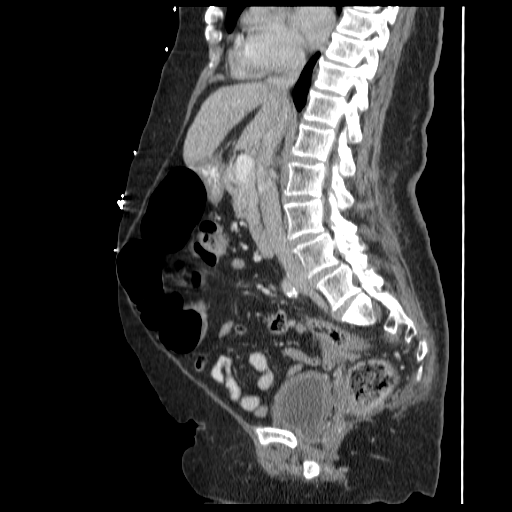
[im 98/180  soft-tissue]
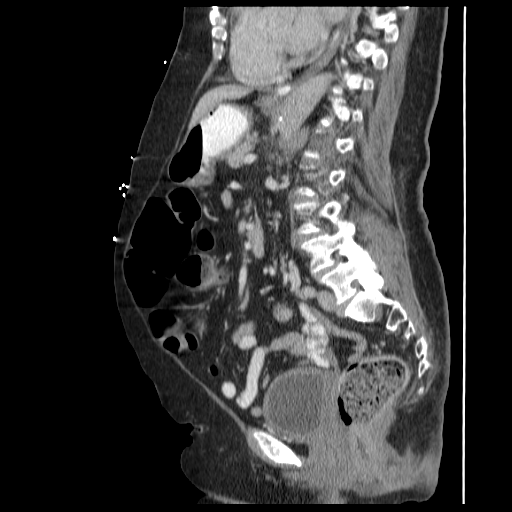
[im 114/180  soft-tissue]
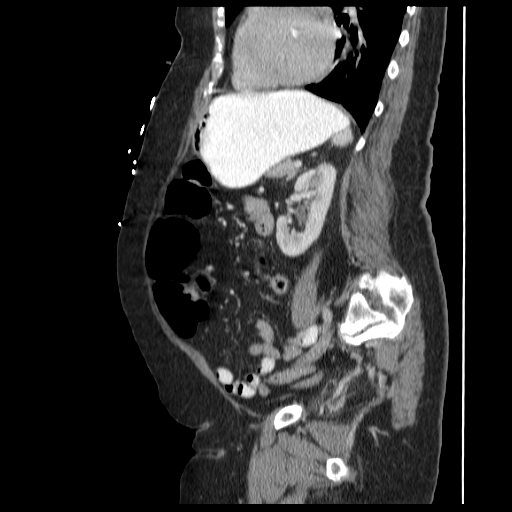
[im 147/180  soft-tissue]
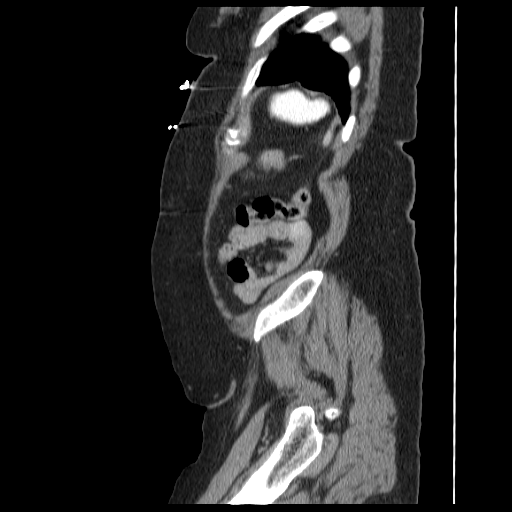
[im 163/180  soft-tissue]
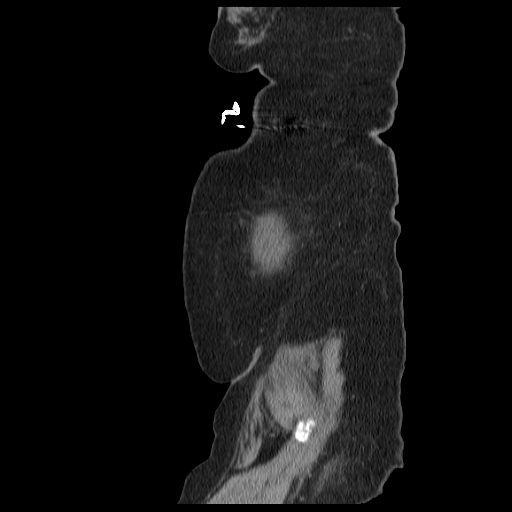

[14 of 32 positions shown; findings below may reference images not displayed]

FINDINGS: There is nonspecific, mild, diffuse rectal wall
thickening.  There is no evidence of bowel obstruction.  There is
no adenopathy or free fluid within the abdomen or pelvis.  Several
coarsely calcified lymph nodes are noted within the upper abdomen,
likely from old granulomatous disease.  There is mild, diffuse
fatty infiltration of the liver. No focal hepatic lesions are
present.  The spleen, pancreas, adrenal glands have a normal
appearance.  There are small simple cysts on both kidneys. There is
mild, diffuse bladder wall thickening.  There are no osseous
lesions.  The lung bases are clear.
IMPRESSION: There is nonspecific, mild rectal wall thickening. This could be
related to inflammation from hemorrhoids but direct visualization
or tissue sampling may be indicated if there is concern for rectal
carcinoma.

Mild bladder wall thickening could be related to cystitis or
chronic outlet obstruction.

## 2013-07-13 ENCOUNTER — Other Ambulatory Visit: Payer: Self-pay

## 2013-07-13 DIAGNOSIS — Z1231 Encounter for screening mammogram for malignant neoplasm of breast: Secondary | ICD-10-CM

## 2013-08-06 ENCOUNTER — Ambulatory Visit
Admission: RE | Admit: 2013-08-06 | Discharge: 2013-08-06 | Disposition: A | Discharge status: Home / Self Care | Payer: Self-pay | Source: Ambulatory Visit

## 2013-08-06 DIAGNOSIS — Z1231 Encounter for screening mammogram for malignant neoplasm of breast: Secondary | ICD-10-CM

## 2014-05-14 ENCOUNTER — Encounter (HOSPITAL_COMMUNITY): Payer: Self-pay | Admitting: Emergency Medicine

## 2014-05-14 ENCOUNTER — Emergency Department (HOSPITAL_COMMUNITY): Payer: Medicare Other

## 2014-05-14 ENCOUNTER — Observation Stay (HOSPITAL_COMMUNITY)
Admission: EM | Admit: 2014-05-14 | Discharge: 2014-05-16 | Disposition: A | Discharge status: Home / Self Care | Payer: Medicare Other | Attending: Internal Medicine | Admitting: Internal Medicine

## 2014-05-14 ENCOUNTER — Inpatient Hospital Stay (HOSPITAL_COMMUNITY): Payer: Medicare Other

## 2014-05-14 DIAGNOSIS — I482 Chronic atrial fibrillation, unspecified: Secondary | ICD-10-CM | POA: Diagnosis present

## 2014-05-14 DIAGNOSIS — I639 Cerebral infarction, unspecified: Secondary | ICD-10-CM

## 2014-05-14 DIAGNOSIS — Z7902 Long term (current) use of antithrombotics/antiplatelets: Secondary | ICD-10-CM | POA: Diagnosis not present

## 2014-05-14 DIAGNOSIS — M109 Gout, unspecified: Secondary | ICD-10-CM | POA: Insufficient documentation

## 2014-05-14 DIAGNOSIS — I69351 Hemiplegia and hemiparesis following cerebral infarction affecting right dominant side: Secondary | ICD-10-CM | POA: Insufficient documentation

## 2014-05-14 DIAGNOSIS — K219 Gastro-esophageal reflux disease without esophagitis: Secondary | ICD-10-CM | POA: Diagnosis not present

## 2014-05-14 DIAGNOSIS — I480 Paroxysmal atrial fibrillation: Secondary | ICD-10-CM

## 2014-05-14 DIAGNOSIS — Z7982 Long term (current) use of aspirin: Secondary | ICD-10-CM | POA: Insufficient documentation

## 2014-05-14 DIAGNOSIS — I10 Essential (primary) hypertension: Secondary | ICD-10-CM

## 2014-05-14 DIAGNOSIS — I4891 Unspecified atrial fibrillation: Secondary | ICD-10-CM

## 2014-05-14 DIAGNOSIS — I5042 Chronic combined systolic (congestive) and diastolic (congestive) heart failure: Secondary | ICD-10-CM | POA: Diagnosis not present

## 2014-05-14 DIAGNOSIS — E785 Hyperlipidemia, unspecified: Secondary | ICD-10-CM | POA: Insufficient documentation

## 2014-05-14 DIAGNOSIS — G459 Transient cerebral ischemic attack, unspecified: Secondary | ICD-10-CM | POA: Diagnosis not present

## 2014-05-14 DIAGNOSIS — R471 Dysarthria and anarthria: Secondary | ICD-10-CM

## 2014-05-14 DIAGNOSIS — Z66 Do not resuscitate: Secondary | ICD-10-CM | POA: Diagnosis not present

## 2014-05-14 DIAGNOSIS — I509 Heart failure, unspecified: Secondary | ICD-10-CM

## 2014-05-14 DIAGNOSIS — R4701 Aphasia: Secondary | ICD-10-CM

## 2014-05-14 DIAGNOSIS — Z853 Personal history of malignant neoplasm of breast: Secondary | ICD-10-CM | POA: Diagnosis not present

## 2014-05-14 HISTORY — DX: Chronic atrial fibrillation, unspecified: I48.20

## 2014-05-14 LAB — CBC
HCT: 42.3 % (ref 36.0–46.0)
HEMOGLOBIN: 14.1 g/dL (ref 12.0–15.0)
MCH: 27.6 pg (ref 26.0–34.0)
MCHC: 33.3 g/dL (ref 30.0–36.0)
MCV: 82.9 fL (ref 78.0–100.0)
Platelets: 165 10*3/uL (ref 150–400)
RBC: 5.1 MIL/uL (ref 3.87–5.11)
RDW: 14.1 % (ref 11.5–15.5)
WBC: 7.3 10*3/uL (ref 4.0–10.5)

## 2014-05-14 LAB — URINALYSIS, ROUTINE W REFLEX MICROSCOPIC
Bilirubin Urine: NEGATIVE
Glucose, UA: NEGATIVE mg/dL
HGB URINE DIPSTICK: NEGATIVE
Ketones, ur: NEGATIVE mg/dL
Nitrite: NEGATIVE
PROTEIN: NEGATIVE mg/dL
Specific Gravity, Urine: 1.008 (ref 1.005–1.030)
UROBILINOGEN UA: 0.2 mg/dL (ref 0.0–1.0)
pH: 5.5 (ref 5.0–8.0)

## 2014-05-14 LAB — COMPREHENSIVE METABOLIC PANEL
ALK PHOS: 100 U/L (ref 39–117)
ALT: 17 U/L (ref 0–35)
AST: 25 U/L (ref 0–37)
Albumin: 3.9 g/dL (ref 3.5–5.2)
Anion gap: 17 — ABNORMAL HIGH (ref 5–15)
BILIRUBIN TOTAL: 0.5 mg/dL (ref 0.3–1.2)
BUN: 19 mg/dL (ref 6–23)
CO2: 25 mEq/L (ref 19–32)
CREATININE: 0.98 mg/dL (ref 0.50–1.10)
Calcium: 10.2 mg/dL (ref 8.4–10.5)
Chloride: 102 mEq/L (ref 96–112)
GFR calc non Af Amer: 51 mL/min — ABNORMAL LOW (ref >90)
GFR, EST AFRICAN AMERICAN: 59 mL/min — AB (ref >90)
GLUCOSE: 111 mg/dL — AB (ref 70–99)
Potassium: 3.7 mEq/L (ref 3.7–5.3)
Sodium: 144 mEq/L (ref 137–147)
TOTAL PROTEIN: 7.9 g/dL (ref 6.0–8.3)

## 2014-05-14 LAB — DIFFERENTIAL
Basophils Absolute: 0 10*3/uL (ref 0.0–0.1)
Basophils Relative: 0 % (ref 0–1)
Eosinophils Absolute: 0.4 10*3/uL (ref 0.0–0.7)
Eosinophils Relative: 6 % — ABNORMAL HIGH (ref 0–5)
LYMPHS ABS: 2.7 10*3/uL (ref 0.7–4.0)
Lymphocytes Relative: 37 % (ref 12–46)
MONOS PCT: 10 % (ref 3–12)
Monocytes Absolute: 0.7 10*3/uL (ref 0.1–1.0)
Neutro Abs: 3.5 10*3/uL (ref 1.7–7.7)
Neutrophils Relative %: 47 % (ref 43–77)

## 2014-05-14 LAB — URINE MICROSCOPIC-ADD ON

## 2014-05-14 LAB — PROTIME-INR
INR: 1.11 (ref 0.00–1.49)
Prothrombin Time: 14.4 seconds (ref 11.6–15.2)

## 2014-05-14 LAB — CBG MONITORING, ED: Glucose-Capillary: 132 mg/dL — ABNORMAL HIGH (ref 70–99)

## 2014-05-14 LAB — I-STAT TROPONIN, ED: TROPONIN I, POC: 0.01 ng/mL (ref 0.00–0.08)

## 2014-05-14 LAB — APTT: aPTT: 32 seconds (ref 24–37)

## 2014-05-14 MED ORDER — METOPROLOL SUCCINATE ER 25 MG PO TB24
25.0000 mg | ORAL_TABLET | Freq: Every day | ORAL | Status: DC
  Administered 2014-05-15 – 2014-05-16 (×2): 25 mg via ORAL
  Filled 2014-05-14 (×2): qty 1

## 2014-05-14 MED ORDER — ALPRAZOLAM 0.25 MG PO TABS
0.1250 mg | ORAL_TABLET | Freq: Two times a day (BID) | ORAL | Status: DC | PRN
  Administered 2014-05-14: 0.25 mg via ORAL
  Filled 2014-05-14: qty 1

## 2014-05-14 MED ORDER — HYDROCHLOROTHIAZIDE 12.5 MG PO CAPS: 12.5000 mg | ORAL_CAPSULE | Freq: Every day | ORAL | Status: DC | PRN

## 2014-05-14 MED ORDER — STROKE: EARLY STAGES OF RECOVERY BOOK
Freq: Once | Status: AC
  Administered 2014-05-14: 17:00:00
  Filled 2014-05-14: qty 1

## 2014-05-14 MED ORDER — PANTOPRAZOLE SODIUM 40 MG PO TBEC
40.0000 mg | DELAYED_RELEASE_TABLET | Freq: Every day | ORAL | Status: DC
  Administered 2014-05-14 – 2014-05-16 (×3): 40 mg via ORAL
  Filled 2014-05-14 (×3): qty 1

## 2014-05-14 MED ORDER — SENNOSIDES-DOCUSATE SODIUM 8.6-50 MG PO TABS: 1.0000 | ORAL_TABLET | Freq: Every day | ORAL | Status: DC

## 2014-05-14 MED ORDER — ASPIRIN EC 81 MG PO TBEC
81.0000 mg | DELAYED_RELEASE_TABLET | Freq: Every day | ORAL | Status: DC
  Administered 2014-05-15: 81 mg via ORAL
  Filled 2014-05-14: qty 1

## 2014-05-14 MED ORDER — LISINOPRIL 2.5 MG PO TABS
2.5000 mg | ORAL_TABLET | Freq: Every day | ORAL | Status: DC
  Administered 2014-05-15 – 2014-05-16 (×2): 2.5 mg via ORAL
  Filled 2014-05-14 (×2): qty 1

## 2014-05-14 MED ORDER — DOCUSATE SODIUM 100 MG PO CAPS
200.0000 mg | ORAL_CAPSULE | Freq: Two times a day (BID) | ORAL | Status: DC
  Administered 2014-05-14 – 2014-05-16 (×4): 200 mg via ORAL
  Filled 2014-05-14 (×4): qty 2

## 2014-05-14 MED ORDER — ENOXAPARIN SODIUM 40 MG/0.4ML ~~LOC~~ SOLN
40.0000 mg | SUBCUTANEOUS | Status: DC
  Administered 2014-05-14 – 2014-05-15 (×2): 40 mg via SUBCUTANEOUS
  Filled 2014-05-14 (×2): qty 0.4

## 2014-05-14 MED ORDER — ALLOPURINOL 100 MG PO TABS
100.0000 mg | ORAL_TABLET | Freq: Every day | ORAL | Status: DC
  Administered 2014-05-15 – 2014-05-16 (×2): 100 mg via ORAL
  Filled 2014-05-14 (×2): qty 1

## 2014-05-14 MED ORDER — AMLODIPINE BESYLATE 5 MG PO TABS: 10.0000 mg | ORAL_TABLET | Freq: Every day | ORAL | Status: DC

## 2014-05-14 MED ORDER — METOPROLOL TARTRATE 25 MG PO TABS
25.0000 mg | ORAL_TABLET | Freq: Two times a day (BID) | ORAL | Status: DC
  Administered 2014-05-14: 25 mg via ORAL
  Filled 2014-05-14 (×2): qty 1

## 2014-05-14 MED ORDER — SPIRONOLACTONE 25 MG PO TABS
25.0000 mg | ORAL_TABLET | Freq: Every day | ORAL | Status: DC
  Administered 2014-05-15 – 2014-05-16 (×2): 25 mg via ORAL
  Filled 2014-05-14 (×2): qty 1

## 2014-05-14 MED ORDER — PRAVASTATIN SODIUM 40 MG PO TABS
40.0000 mg | ORAL_TABLET | Freq: Every day | ORAL | Status: DC
  Administered 2014-05-14 – 2014-05-16 (×3): 40 mg via ORAL
  Filled 2014-05-14 (×3): qty 1

## 2014-05-14 MED ORDER — CLOPIDOGREL BISULFATE 75 MG PO TABS
75.0000 mg | ORAL_TABLET | Freq: Every day | ORAL | Status: DC
  Administered 2014-05-15: 75 mg via ORAL
  Filled 2014-05-14: qty 1

## 2014-05-14 NOTE — Consult Note (Signed)
Referring Physician: WARD, K    Chief Complaint: right-sided weakness and speech difficulty.  HPI: [name] is an 80 y.o. female with a history of atrial fibrillation on anticoagulation, hypertension and hyperlipidemia, as well as previous left cerebral infarction, presenting with worsening of right-sided weakness and expressive aphasia. She was last known well at 8:30 AM today. His been taking aspirin and Plavix daily. CT scan of her head showed no acute intracranial abnormality. Deficits improved prior to arriving in the emergency room. She still had mild dysarthria and finding difficulty. NIH stroke score was 2.  LSN: 8:30 AM on 05/14/2014 tPA Given: No: deficits rapidly resolving mRankin:  Past Medical History  Diagnosis Date  . Hypertension   . CHF (congestive heart failure)   . Cancer   . Stroke     No family history on file.   Medications: I have reviewed the patient's current medications.  ROS: History obtained from the patient  General ROS: negative for - chills, fatigue, fever, night sweats, weight gain or weight loss Psychological ROS: negative for - behavioral disorder, hallucinations, memory difficulties, mood swings or suicidal ideation Ophthalmic ROS: negative for - blurry vision, double vision, eye pain or loss of vision ENT ROS: negative for - epistaxis, nasal discharge, oral lesions, sore throat, tinnitus or vertigo Allergy and Immunology ROS: negative for - hives or itchy/watery eyes Hematological and Lymphatic ROS: negative for - bleeding problems, bruising or swollen lymph nodes Endocrine ROS: negative for - galactorrhea, hair pattern changes, polydipsia/polyuria or temperature intolerance Respiratory ROS: negative for - cough, hemoptysis, shortness of breath or wheezing Cardiovascular ROS: negative for - chest pain, dyspnea on exertion, edema or irregular heartbeat Gastrointestinal ROS: negative for - abdominal pain, diarrhea, hematemesis,  nausea/vomiting or stool incontinence Genito-Urinary ROS: negative for - dysuria, hematuria, incontinence or urinary frequency/urgency Musculoskeletal ROS: negative for - joint swelling or muscular weakness Neurological ROS: as noted in HPI Dermatological ROS: negative for rash and skin lesion changes  Physical Examination: Blood pressure 123/57, pulse 94, temperature 99.4 F (37.4 C), temperature source Oral, resp. rate 18, weight 62.914 kg (138 lb 11.2 oz), SpO2 100 %.  Neurologic Examination: Mental Status: Alert, oriented, thought content appropriate.  Speech slightly slurred with slight hesitancy and word finding difficulty. Able to follow commands without difficulty. Cranial Nerves: II-Visual fields were normal. III/IV/VI-Pupils were equal and reacted. Extraocular movements were full and conjugate.    V/VII-no facial numbness and no facial weakness. VIII-normal. X-mild dysarthria. Motor: 5/5 bilaterally with normal tone and bulk Sensory: Normal throughout. Deep Tendon Reflexes: 1+ and symmetric. Plantars: mute bilaterally Cerebellar: Normal finger-to-nose testing.  Ct Head (brain) Wo Contrast  05/14/2014   CLINICAL DATA:  Acute right-sided weakness. History of CVA. History of breast cancer. Code stroke. Initial encounter.  EXAM: CT HEAD WITHOUT CONTRAST  TECHNIQUE: Contiguous axial images were obtained from the base of the skull through the vertex without intravenous contrast.  COMPARISON:  04/30/2011; brain MRI - 04/30/2011  FINDINGS: Re- demonstrated advanced atrophy with centralized volume loss and commensurate expected dilatation of the ventricular system. Scattered periventricular hypodensities compatible with microvascular ischemic disease. Old infarcts involving the left parietal lobe (image 20, series 2) and cerebellum (image 13, series 2). Given extensive background parenchymal abnormalities, there is no CT evidence of acute large territory infarct. No intraparenchymal or  extra-axial mass or hemorrhage. Unchanged size and configuration of the ventricles and basilar cisterns. No midline shift. Intracranial atherosclerosis. Limited visualization of the paranasal sinuses and mastoid air cells are  normal. No air-fluid levels. Regional soft tissues appear normal. Post bilateral cataract surgery.  IMPRESSION: 1. No acute intracranial process. 2. Advanced atrophy and microvascular ischemic disease and sequela of prior infarcts involving the left parietal lobe and cerebellum. Critical Value/emergent results were called by telephone at the time of interpretation on 05/14/2014 at 11:52 am to Dr. Nicole Kindred, who verbally acknowledged these results.   Electronically Signed   By: Sandi Mariscal M.D.   On: 05/14/2014 11:55    Assessment: 80 y.o. female with multiple risk factors for stroke as well as previous left MCA territory, presenting with probable left MCA territory TIA. Recurrent acute stroke cannot be ruled out, however.  Stroke Risk Factors - atrial fibrillation, hyperlipidemia and hypertension  Plan: 1. HgbA1c, fasting lipid panel 2. MRI, MRA  of the brain without contrast 3. PT consult, OT consult, Speech consult 4. Echocardiogram 5. Carotid dopplers 6. Prophylactic therapy-aspirin 81 mg per day and Plavix 75 mg per day 7. Risk factor modification 8. Telemetry monitoring   C.R. Nicole Kindred, MD [facility] [phone]  05/14/2014, 12:04 PM

## 2014-05-14 NOTE — ED Provider Notes (Signed)
TIME SEEN: 12:10 PM  CHIEF COMPLAINT: code stroke  HPI: Pt is a 80 y.o. F with history of hypertension, CHF, prior stroke on Plavix who presents to the emergency department from her home with aphasia, dysarthria that started at 8:30 AM. Patient's symptoms slowly improving. There were also reportsfrom EMSof possible right-sided weakness. Given symptoms improved, could stroke canceled. Blood glucose normal. Patient denies any headache or head injury. No chest pain or shortness of breath. No recent fevers, cough, vomiting or diarrhea.   PCP is Heath Gold   ROS: See HPI Constitutional: no fever  Eyes: no drainage  ENT: no runny nose   Cardiovascular:  no chest pain  Resp: no SOB  GI: no vomiting GU: no dysuria Integumentary: no rash  Allergy: no hives  Musculoskeletal: no leg swelling  Neurological:  slurred speech ROS otherwise negative  PAST MEDICAL HISTORY/PAST SURGICAL HISTORY:  Past Medical History  Diagnosis Date  . Hypertension   . CHF (congestive heart failure)   . Cancer   . Stroke     MEDICATIONS:  Prior to Admission medications   Medication Sig Start Date End Date Taking? Authorizing Provider  allopurinol (ZYLOPRIM) 100 MG tablet Take 100 mg by mouth daily.    Historical Provider, MD  amLODipine (NORVASC) 10 MG tablet Take 10 mg by mouth daily.    Historical Provider, MD  aspirin EC 81 MG tablet Take 81 mg by mouth daily.    Historical Provider, MD  cholecalciferol (VITAMIN D) 1000 UNITS tablet Take 1,000 Units by mouth 2 (two) times daily.    Historical Provider, MD  clopidogrel (PLAVIX) 75 MG tablet Take 75 mg by mouth daily.    Historical Provider, MD  docusate sodium (COLACE) 100 MG capsule Take 200 mg by mouth 2 (two) times daily.    Historical Provider, MD  hydrochlorothiazide (MICROZIDE) 12.5 MG capsule Take 12.5 mg by mouth daily as needed (for swelling).    Historical Provider, MD  metoprolol succinate (TOPROL-XL) 25 MG 24 hr tablet Take 25 mg by mouth  daily.    Historical Provider, MD  omeprazole (PRILOSEC) 20 MG capsule Take 20 mg by mouth daily.    Historical Provider, MD  pravastatin (PRAVACHOL) 40 MG tablet Take 40 mg by mouth daily.    Historical Provider, MD  Propylene Glycol (SYSTANE BALANCE OP) Apply 1 drop to eye daily.    Historical Provider, MD    ALLERGIES:  Allergies  Allergen Reactions  . Codeine Nausea Only  . Penicillins Nausea Only  . Sulfa Antibiotics Nausea Only    SOCIAL HISTORY:  History  Substance Use Topics  . Smoking status: Not on file  . Smokeless tobacco: Not on file  . Alcohol Use: No    FAMILY HISTORY: No family history on file.  EXAM: Pulse 119  Resp 23  Wt 138 lb 11.2 oz (62.914 kg)  SpO2 100% CONSTITUTIONAL: Alert and oriented and responds appropriately to questions. Well-appearing; well-nourished HEAD: Normocephalic EYES: Conjunctivae clear, PERRL ENT: normal nose; no rhinorrhea; moist mucous membranes; pharynx without lesions noted NECK: Supple, no meningismus, no LAD  CARD: RRR; S1 and S2 appreciated; no murmurs, no clicks, no rubs, no gallops RESP: Normal chest excursion without splinting or tachypnea; breath sounds clear and equal bilaterally; no wheezes, no rhonchi, no rales,  ABD/GI: Normal bowel sounds; non-distended; soft, non-tender, no rebound, no guarding BACK:  The back appears normal and is non-tender to palpation, there is no CVA tenderness EXT: Normal ROM in all joints; non-tender  to palpation; no edema; normal capillary refill; no cyanosis    SKIN: Normal color for age and race; warm NEURO: Moves all extremities equally; sensation to light touch intact diffusely, cranial nerves II through XII intact, mild aphasia and dysarthria that is slowly improving, NIH scale 2 PSYCH: The patient's mood and manner are appropriate. Grooming and personal hygiene are appropriate.  MEDICAL DECISION MAKING: Pt here with stroke symptoms are slowly improving. Seen by Dr. Nicole Kindred who does not  feel she is a TPA candidate given symptoms improving and recommends medicine admission. Head CT shows no acute abnormalities but multiple old strokes.  ED PROGRESS: Labs and urine are unremarkable. Symptoms continue to improve. We'll discuss with medicine for admission.  1:00 PM  Spoke with Dr. Wynelle Cleveland for admission to tele, obs.  Pt did take her morning medications this AM per her report.  There is no family at bedside.  Pt stable.  No complaints.     EKG Interpretation  Date/Time:  Saturday May 14 2014 11:53:17 EST Ventricular Rate:  120 PR Interval:    QRS Duration: 78 QT Interval:  348 QTC Calculation: 492 R Axis:   -9 Text Interpretation:  Atrial fibrillation Borderline repolarization abnormality Borderline prolonged QT interval Confirmed by Mackey Varricchio,  DO, Vollie Aaron [phone]) on 05/14/2014 12:44:37 PM        [place], DO 05/14/14 1305

## 2014-05-14 NOTE — ED Notes (Signed)
Patient transported to MRI, will be transported from MRI to [facility].  Kelly from [employer] updated.

## 2014-05-14 NOTE — ED Notes (Signed)
CODE STROKE CANCELED AT THIS TIME.

## 2014-05-14 NOTE — ED Notes (Signed)
Per EMS:  LSN [id number], was seen by family before patient went to bathroom, when she was finished with the bathroom family saw that she had more pronounced right sided weakness and trouble with speech.  EMS was called, 1st paramedic noted right sided weakness, 2nd paramedic noted that she had equal grip strength but dysarthria had persisted.  Patient is on plavix, previous stroke with right sided deficits.

## 2014-05-14 NOTE — H&P (Signed)
[facility] History and Physical  [name] TRZ:[id number] DOB: 06-22-1927 DOA: 05/14/2014   PCP: [name]., MD   Chief Complaint: fall  HPI: [name] is a 80 y.o. female with a past medical history of hypertension, coronary artery disease status post stenting, unspecified congestive heart failure, breast cancer and CVA with expressive aphasia. The patient states that she is here for a fall however I've spoken with her son and obtained further detail in regards to her history.  He states that the patient was doing well this morning and was able eat her breakfast without any trouble. Around 9 to 10:00 in the morning he noticed that her speech became slurred and she appeared to become confused. This episode lasted for a good hour and was already improving prior to her coming to the hospital. By the time she arrived to the ER symptoms had resolved. Due to her prior CVA, she has difficulty with expressive aphasia and right-sided weakness.  Her son states that he did not notice any worsening in the weakness during this morning's episode. The patient has no complaints. She does admit to having trouble speaking "during her fall" this morning but feels like her speech is back to normal. Based on EKG the patient is in atrial fibrillation. Her son states that she has no history of this in the past.   General: The patient denies anorexia, fever-  - admits to mild weight loss Cardiac: Denies chest pain, syncope, palpitations, pedal edema  Respiratory: Denies cough, shortness of breath, wheezing GI: Denies severe indigestion/heartburn, abdominal pain, nausea, vomiting, diarrhea - has chronic constipation GU: Denies hematuria, incontinence, dysuria  Musculoskeletal: Denies arthritis  Skin: Denies suspicious skin lesions Neurologic: Denies focal weakness or numbness, change in vision  Past Medical History  Diagnosis Date  . Hypertension   . CHF (congestive heart failure)   .  History of breast cancer in female   . CVA (cerebral infarction)   . Atrial fibrillation, chronic     Past Surgical History  Procedure Laterality Date  . Abdominal hysterectomy    . Cholecystectomy    . Mastectomy      right  . Hemorroidectomy      Social History: non-smoker - no alcohol abuse Lives at home with her son    Allergies  Allergen Reactions  . Codeine Nausea Only  . Penicillins Nausea Only  . Sulfa Antibiotics Nausea Only    No family history on file.    Prior to Admission medications   Medication Sig Start Date End Date Taking? Authorizing Provider  acetaminophen (TYLENOL) 500 MG tablet Take 500 mg by mouth every 6 (six) hours as needed.   Yes Historical Provider, MD  allopurinol (ZYLOPRIM) 100 MG tablet Take 100 mg by mouth daily.   Yes Historical Provider, MD  ALPRAZolam (XANAX) 0.25 MG tablet Take 0.125-0.25 mg by mouth 2 (two) times daily as needed for anxiety.    Yes Historical Provider, MD  amLODipine (NORVASC) 10 MG tablet Take 10 mg by mouth daily.   Yes Historical Provider, MD  aspirin EC 81 MG tablet Take 81 mg by mouth daily.   Yes Historical Provider, MD  cholecalciferol (VITAMIN D) 1000 UNITS tablet Take 1,000 Units by mouth 2 (two) times daily.   Yes Historical Provider, MD  clopidogrel (PLAVIX) 75 MG tablet Take 75 mg by mouth daily.   Yes Historical Provider, MD  docusate sodium (COLACE) 100 MG capsule Take 200 mg by mouth 2 (two) times daily.  Yes Historical Provider, MD  hydrochlorothiazide (MICROZIDE) 12.5 MG capsule Take 12.5 mg by mouth daily as needed (for swelling).   Yes Historical Provider, MD  lisinopril (PRINIVIL,ZESTRIL) 2.5 MG tablet Take 2.5 mg by mouth daily.   Yes Historical Provider, MD  Melatonin 3 MG TABS Take 1 tablet by mouth at bedtime.   Yes Historical Provider, MD  metoprolol succinate (TOPROL-XL) 25 MG 24 hr tablet Take 25 mg by mouth daily.   Yes Historical Provider, MD  omeprazole (PRILOSEC) 20 MG capsule Take 20 mg by  mouth daily.   Yes Historical Provider, MD  pravastatin (PRAVACHOL) 40 MG tablet Take 40 mg by mouth daily.   Yes Historical Provider, MD  Propylene Glycol (SYSTANE BALANCE OP) Apply 1 drop to eye daily.   Yes Historical Provider, MD  spironolactone (ALDACTONE) 25 MG tablet Take 25 mg by mouth daily.   Yes Historical Provider, MD     Physical Exam: Filed Vitals:   05/14/14 1300 05/14/14 1315 05/14/14 1330 05/14/14 1345  BP: 113/57 114/60 112/58 129/61  Pulse: 79 81 68 74  Temp:      TempSrc:      Resp: 23 29 19 26   Weight:      SpO2: 100% 100% 100% 100%     General: awake alert oriented 3, no acute distress HEENT: Normocephalic and Atraumatic, Mucous membranes pink                PERRLA; EOM intact; No scleral icterus,                 Nares: Patent, Oropharynx: Clear, Fair Dentition                 Neck: FROM, no cervical lymphadenopathy, thyromegaly, carotid bruit or JVD;  Breasts: deferred CHEST WALL: No tenderness  CHEST: Normal respiration, clear to auscultation bilaterally  HEART: Regular rate and rhythm; no murmurs rubs or gallops  BACK: No kyphosis or scoliosis; no CVA tenderness  ABDOMEN: Positive Bowel Sounds, soft, non-tender; no masses, no organomegaly Rectal Exam: deferred EXTREMITIES: No cyanosis, clubbing, or edema Genitalia: not examined  SKIN:  no rash or ulceration  CNS: Alert and Oriented x 4,expressive aphasia, adequate strength in all 4 extremities, Nonfocal exam, CN 2-12 intact  Labs on Admission:  Basic Metabolic Panel:  Recent Labs Lab 05/14/14 1132  NA 144  K 3.7  CL 102  CO2 25  GLUCOSE 111*  BUN 19  CREATININE 0.98  CALCIUM 10.2   Liver Function Tests:  Recent Labs Lab 05/14/14 1132  AST 25  ALT 17  ALKPHOS 100  BILITOT 0.5  PROT 7.9  ALBUMIN 3.9   No results for input(s): LIPASE, AMYLASE in the last 168 hours. No results for input(s): AMMONIA in the last 168 hours. CBC:  Recent Labs Lab 05/14/14 1132  WBC 7.3   NEUTROABS 3.5  HGB 14.1  HCT 42.3  MCV 82.9  PLT 165   Cardiac Enzymes: No results for input(s): CKTOTAL, CKMB, CKMBINDEX, TROPONINI in the last 168 hours.  BNP (last 3 results) No results for input(s): PROBNP in the last 8760 hours. CBG:  Recent Labs Lab 05/14/14 1204  GLUCAP 132*    Radiological Exams on Admission: Ct Head (brain) Wo Contrast  05/14/2014   CLINICAL DATA:  Acute right-sided weakness. History of CVA. History of breast cancer. Code stroke. Initial encounter.  EXAM: CT HEAD WITHOUT CONTRAST  TECHNIQUE: Contiguous axial images were obtained from the base of the skull through the  vertex without intravenous contrast.  COMPARISON:  04/30/2011; brain MRI - 04/30/2011  FINDINGS: Re- demonstrated advanced atrophy with centralized volume loss and commensurate expected dilatation of the ventricular system. Scattered periventricular hypodensities compatible with microvascular ischemic disease. Old infarcts involving the left parietal lobe (image 20, series 2) and cerebellum (image 13, series 2). Given extensive background parenchymal abnormalities, there is no CT evidence of acute large territory infarct. No intraparenchymal or extra-axial mass or hemorrhage. Unchanged size and configuration of the ventricles and basilar cisterns. No midline shift. Intracranial atherosclerosis. Limited visualization of the paranasal sinuses and mastoid air cells are normal. No air-fluid levels. Regional soft tissues appear normal. Post bilateral cataract surgery.  IMPRESSION: 1. No acute intracranial process. 2. Advanced atrophy and microvascular ischemic disease and sequela of prior infarcts involving the left parietal lobe and cerebellum. Critical Value/emergent results were called by telephone at the time of interpretation on 05/14/2014 at 11:52 am to Dr. Nicole Kindred, who verbally acknowledged these results.   Electronically Signed   By: Sandi Mariscal M.D.   On: 05/14/2014 11:55    EKG: Independently  reviewed. A. Fib with a heart rate of 120 bpm-QT 492  Assessment/Plan Principal Problem:   TIA  -likely embolic in setting of atrial fibrillation -Follow up on MRI - follow-up on TIA workup -decide on long-term anticoagulation once MRI results obtained currently on aspirin and Plavix but will benefit from full anticoagulation with the NOAC -neurology assisting with management -has passed bedside swallow eval-start diet  Active Problems:   Atrial fibrillation, unspecified -we will add Lopressor for rate control -see discussion regarding anticoagulation above -Obtain echo and thyroid studies    Congestive heart failure of unknown etiology -Continue Aldactone and follow up on echo  CAD status post stenting -Continue aspirin and Plavix for now    Essential hypertension -hold Norvasc and start Lopressor for rate control  History of CVA -Appears to be back to baseline now  Consulted: neurology  Code Status: DO NOT RESUSCITATE Family Communication: with son Wynonia Lawman DVT Prophylaxis:Lovenox  Time spent: >45 minutes  [place], MD [facility]  If 7PM-7AM, please contact night-coverage [email or web address] 05/14/2014, 2:05 PM

## 2014-05-14 NOTE — Progress Notes (Signed)
   05/14/14 2100  Clinical Encounter Type  Visited With Patient and family together;Health care provider  Visit Type Initial;Spiritual support;Social support  Referral From Nurse  Spiritual Encounters  Spiritual Needs Emotional;Grief support  Stress Factors  Patient Stress Factors Loss  Family Stress Factors Loss   Chaplain was paged to patient's room at 8:51PM. Nurse informed chaplain that the patient recently lost a family member and needed grief support. When chaplain arrived patient was accompanied by her daughter-in-law. Patient's daughter-in-law explained that the patient's son and her husband passed away two weeks ago. Patient's son was diagnosed with cancer in September and quickly declined and passed away in Apr 27, 2023. This death was shocking for both the patient and the patient's daughter-in-law and both have been been grieving for the past two weeks. Chaplain listened while the patient and the patient's daughter-in-law told stories about how good of a man the patient's son was. Patient and patient's daughter-in-law are trying to support each other and also mentioned having a pastor for spiritual support. Chaplain will continue to provide grief support for patient and patient's daughter-in-law as needed. Gar Ponto, Chaplain  9:39 PM

## 2014-05-14 NOTE — Progress Notes (Signed)
Pt observed to be very teary, crying due to loss of her eldest son last month (Oct 28) in [place], pt's daughter-Inlaw , wife to the diseased son also in room at bedside, requested to speak with a Chaplain, same paged and notified, later came up to see pt and family. Was however reassured, call light at bedside, will continue to monitor. [place], Albin Duckett Efe

## 2014-05-15 DIAGNOSIS — R4701 Aphasia: Secondary | ICD-10-CM

## 2014-05-15 LAB — LIPID PANEL
CHOL/HDL RATIO: 2.6 ratio
Cholesterol: 105 mg/dL (ref 0–200)
HDL: 40 mg/dL (ref >39)
LDL CALC: 46 mg/dL (ref 0–99)
Triglycerides: 93 mg/dL (ref <150)
VLDL: 19 mg/dL (ref 0–40)

## 2014-05-15 LAB — TSH: TSH: 0.633 u[IU]/mL (ref 0.350–4.500)

## 2014-05-15 MED ORDER — LORAZEPAM 0.5 MG PO TABS
0.5000 mg | ORAL_TABLET | Freq: Once | ORAL | Status: AC
  Administered 2014-05-15: 0.5 mg via ORAL
  Filled 2014-05-15: qty 1

## 2014-05-15 MED ORDER — APIXABAN 5 MG PO TABS
5.0000 mg | ORAL_TABLET | Freq: Two times a day (BID) | ORAL | Status: DC
  Administered 2014-05-15 – 2014-05-16 (×2): 5 mg via ORAL
  Filled 2014-05-15 (×2): qty 1

## 2014-05-15 NOTE — Progress Notes (Signed)
Utilization Review Completed.   Joi Leyva, RN, BSN Nurse Case Manager  

## 2014-05-15 NOTE — Progress Notes (Signed)
STROKE TEAM PROGRESS NOTE   HISTORY [name] is an 80 y.o. female with a history of atrial fibrillation, hypertension and hyperlipidemia, as well as previous left cerebral infarction, presenting with worsening of right-sided weakness and expressive aphasia. She was last known well at 8:30 AM today. Has been taking aspirin and Plavix daily. CT scan of her head showed no acute intracranial abnormality. Deficits improved prior to arriving in the emergency room. She still had mild dysarthria and finding difficulty. NIH stroke score was 2.  LSN: 8:30 AM on 05/14/2014 tPA Given: No: deficits rapidly resolving   SUBJECTIVE (INTERVAL HISTORY) The patient voices no complaints. She feels back to baseline.   OBJECTIVE Temp:  [97.7 F (36.5 C)-99.5 F (37.5 C)] 98.1 F (36.7 C) (11/08 0521) Pulse Rate:  [31-119] 56 (11/08 0521) Cardiac Rhythm:  [-] Heart block (11/08 0400) Resp:  [18-29] 20 (11/08 0521) BP: (112-138)/(53-90) 118/60 mmHg (11/08 0521) SpO2:  [98 %-100 %] 98 % (11/08 0521) Weight:  [137 lb 12.8 oz (62.506 kg)-138 lb 11.2 oz (62.914 kg)] 137 lb 12.8 oz (62.506 kg) (11/07 1530)   Recent Labs Lab 05/14/14 1204  GLUCAP 132*    Recent Labs Lab 05/14/14 1132  NA 144  K 3.7  CL 102  CO2 25  GLUCOSE 111*  BUN 19  CREATININE 0.98  CALCIUM 10.2    Recent Labs Lab 05/14/14 1132  AST 25  ALT 17  ALKPHOS 100  BILITOT 0.5  PROT 7.9  ALBUMIN 3.9    Recent Labs Lab 05/14/14 1132  WBC 7.3  NEUTROABS 3.5  HGB 14.1  HCT 42.3  MCV 82.9  PLT 165   No results for input(s): CKTOTAL, CKMB, CKMBINDEX, TROPONINI in the last 168 hours.  Recent Labs  05/14/14 1132  LABPROT 14.4  INR 1.11    Recent Labs  05/14/14 1211  COLORURINE YELLOW  LABSPEC 1.008  PHURINE 5.5  GLUCOSEU NEGATIVE  HGBUR NEGATIVE  BILIRUBINUR NEGATIVE  KETONESUR NEGATIVE  PROTEINUR NEGATIVE  UROBILINOGEN 0.2  NITRITE NEGATIVE  LEUKOCYTESUR SMALL*       Component Value Date/Time    CHOL 105 05/15/2014 0640   TRIG 93 05/15/2014 0640   HDL 40 05/15/2014 0640   CHOLHDL 2.6 05/15/2014 0640   VLDL 19 05/15/2014 0640   LDLCALC 46 05/15/2014 0640   Lab Results  Component Value Date   HGBA1C 5.8* 04/30/2011   No results found for: LABOPIA, COCAINSCRNUR, LABBENZ, AMPHETMU, THCU, LABBARB  No results for input(s): ETH in the last 168 hours.  Ct Head (brain) Wo Contrast 05/14/2014    1. No acute intracranial process.  2. Advanced atrophy and microvascular ischemic disease and sequela of prior infarcts involving the left parietal lobe and cerebellum.     MRI / MRA Brain Wo Contrast 05/14/2014    1. No acute intracranial abnormality.  2. Advanced atrophy and diffuse white matter change is similar to the prior studies.  3. Remote infarcts involving the left superior cerebellum and posterior left frontal lobe.  4. Mild the right anterior paranasal sinus disease.  5. Mild to moderate diffuse medium and small vessel disease is similar the prior exam.       PHYSICAL EXAM NEUROLOGIC:   MENTAL STATUS: awake, alert, oriented, language fluent,  follows simple commands.  CRANIAL NERVES: pupils equal and reactive to light,extraocular muscles intact, facial sensation and strength symmetric, uvula midlinec, tongue midline MOTOR: normal bulk and tone, Strength - 5/5 SENSORY: normal and symmetric to light touch  COORDINATION: finger-nose-finger normal      ASSESSMENT/PLAN [name] is a 80 y.o. female with history of atrial fibrillation, previous strokes, hypertension, and hyperlipidemia presenting with right-sided weakness and expressive aphasia. She did not receive IV t-PA due to resolution of her deficits.  TIA:  Dominant - most likely embolic secondary to atrial fibrillation.  Resultant  Resolution of deficits  MRI  No acute abnormality  MRA - Mild to moderate diffuse medium and small vessel disease.     Carotid Doppler pending  2D Echo pending  LDL  46  HgbA1c pending  Lovenox for VTE prophylaxis  Diet Heart with thin liquids  aspirin 81 mg orally every day and clopidogrel 75 mg orally every day prior to admission, now on aspirin 81 mg orally every day and clopidogrel 75 mg orally every day  Patient counseled to be compliant with her antithrombotic medications  Ongoing aggressive risk factor management  Therapy recommendations: Follow-up PT and OT ;Outpatient vs HH - Supervision/Assistance - 24 hour  Disposition: pending  Hypertension  Home meds: - Norvasc 10 mg QD,  hydrochlorothiazide 12.5 mg QD,  Lisinopril 2.5 mg QD,  Toprol-XL 25 mg QD, Aldactone 25 mg QD  Blood pressure controlled  Hyperlipidemia  Home meds:  Pravachol 40 mg daily resumed in hospital  LDL 46,  goal < 70  Continue statin at discharge   Other Stroke Risk Factors Advanced age Obesity, Body mass index is 27.82 kg/(m^2).  Hx stroke/TIA   Other Active Problems    Other Pertinent History  CHF history  Cancer history   Start Eliquis for history of atrial fibrillation. DC aspirin and Plavix.  Okay to discharge when workup is complete. Follow-up with Dr. Erlinda Hong in 4-6 weeks  Will sign off [facility] day # Arma PA-C [facility] Pager [phone] 05/15/2014, 8:39 AM     To contact Stroke Continuity provider, please refer to [email or web address] After hours, contact General Neurology

## 2014-05-15 NOTE — Progress Notes (Signed)
ANTICOAGULATION CONSULT NOTE - Initial Consult  Pharmacy Consult for apixaban Indication: atrial fibrillation  Allergies  Allergen Reactions  . Codeine Nausea Only  . Penicillins Nausea Only  . Sulfa Antibiotics Nausea Only    Patient Measurements: Height: 4\' 11"  (149.9 cm) Weight: 137 lb 12.8 oz (62.506 kg) IBW/kg (Calculated) : 43.2 Heparin Dosing Weight:   Vital Signs: Temp: 97.9 F (36.6 C) (11/08 1013) Temp Source: Oral (11/08 1013) BP: 110/71 mmHg (11/08 1031) Pulse Rate: 68 (11/08 1031)  Labs:  Recent Labs  05/14/14 1132  HGB 14.1  HCT 42.3  PLT 165  APTT 32  LABPROT 14.4  INR 1.11  CREATININE 0.98    Estimated Creatinine Clearance: 33.7 mL/min (by C-G formula based on Cr of 0.98).   Medical History: Past Medical History  Diagnosis Date  . Hypertension   . CHF (congestive heart failure)   . History of breast cancer in female   . CVA (cerebral infarction)   . Atrial fibrillation, chronic     Medications:  Scheduled:  . allopurinol  100 mg Oral Daily  . docusate sodium  200 mg Oral BID  . lisinopril  2.5 mg Oral Daily  . metoprolol succinate  25 mg Oral Daily  . pantoprazole  40 mg Oral Daily  . pravastatin  40 mg Oral Daily  . spironolactone  25 mg Oral Daily   Infusions:    Assessment: 80 yo female with afib will be started on apixaban.  Patient weighs 62.5 kg and SCr 0.98 on 05/14/14.  Received one dose of lovenox 40 mg sq at 1427 on 11/08  Goal of Therapy:   Monitor platelets by anticoagulation protocol: Yes   Plan:  - d/c lovenox - apixaban 5 mg po bid - monitor renal function and weight to see if apixaban dose needs to be adjusted  Gerold Sar, Tsz-Yin 05/15/2014,3:05 PM

## 2014-05-15 NOTE — Plan of Care (Signed)
Problem: Acute Treatment Outcomes Goal: Other Acute Treatment Outcomes Outcome: Not Applicable Date Met:  83/15/17  Problem: Progression Outcomes Goal: If vent dependent, tolerates weaning Outcome: Not Applicable Date Met:  61/60/73 Goal: Other Progression Outcomes Outcome: Not Applicable Date Met:  71/06/26

## 2014-05-15 NOTE — Progress Notes (Signed)
VASCULAR LAB PRELIMINARY  PRELIMINARY  PRELIMINARY  PRELIMINARY  Carotid duplex  completed.    Preliminary report:  Bilateral:  1-39% ICA stenosis.  Vertebral artery flow is antegrade.      Katilin Raynes, RVT 05/15/2014, 3:52 PM

## 2014-05-15 NOTE — Progress Notes (Signed)
Pt had about 16 runs of v-tach at [place], denies and discomfort but requested for a sleep aid, NP Walden Field (on call) paged and notified, ordered to give tab ativan 0.5mg , same given at 0153, will continue to monitor. Obas[place]ie-Asidi, Farren Landa Efe

## 2014-05-15 NOTE — Evaluation (Signed)
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    Clinical Impression   Pt s/p above. Pt independent with ADLs, PTA. Feel pt will benefit from acute OT to increase independence prior to d/c. Recommending HHOT upon d/c.    Follow Up Recommendations  Home health OT;Supervision/Assistance - 24 hour    Equipment Recommendations  None recommended by OT    Recommendations for Other Services       Precautions / Restrictions Precautions Precautions: Fall Restrictions Weight Bearing Restrictions: No      Mobility Bed Mobility               General bed mobility comments: not assessed  Transfers Overall transfer level: Needs assistance Equipment used: Rolling walker (2 wheeled) Transfers: Sit to/from Stand Sit to Stand: Min assist/Min guard            Balance                                            ADL Overall ADL's : Needs assistance/impaired Eating/Feeding: Set up;Sitting;With adaptive utensils   Grooming: Oral care;Wash/dry face;Standing;Minimal assistance (assist with balance)       Lower Body Bathing: Min guard;Sit to/from stand           Toilet Transfer: Minimal assistance;Ambulation;RW;Comfort height toilet;Grab bars   Toileting- Clothing Manipulation and Hygiene: Sit to/from stand;Minimal assistance       Functional mobility during ADLs: Minimal assistance;Rolling walker General ADL Comments: Daughter in law/pt report difficulty holding onto utensil so OT gave pt foam for utensil. Family member also voicing concern over pt having difficulty squeezing washcloth due to hand weakness, so OT suggested loofah and explained a long handled loofah may be helpful for LB bathing. Pt able to manage  washcloth in bathroom. Educated on safety (rugs, sitting for LB ADLs). Recommended 24/7 supervision at home.  Min A for balance at sink.     Vision  Pt wears glasses: exotropia in left eye                   Perception     Praxis      Pertinent Vitals/Pain Pain Assessment: No/denies pain     Hand Dominance Right   Extremity/Trunk Assessment Upper Extremity Assessment Upper Extremity Assessment: RUE deficits/detail;LUE deficits/detail;Generalized weakness RUE Deficits / Details: weakness in both shoulders and in hands (shoulder strength WFL); unable to make a fist-uses thumb and side of second digit to hold objects LUE Deficits / Details: weakness in both shoulders and hands (shoulder strength WFL);unable to make a fist   Lower Extremity Assessment Lower Extremity Assessment: Defer to PT evaluation       Communication Communication Communication: Expressive difficulties;HOH   Cognition Arousal/Alertness: Awake/alert Behavior During Therapy: WFL for tasks assessed/performed Overall Cognitive Status: Within Functional Limits for tasks assessed                     General Comments       Exercises       Shoulder Instructions      Home Living Family/patient expects to be discharged to:: Private residence Living Arrangements: Children Available Help at Discharge: Family  Type of Home: Mobile home Home Access: Ramped entrance (with rail to front door)     Home Layout: One level     Bathroom Shower/Tub: Occupational psychologist:  [employer] [facility])     Home Equipment: Walker - 4 wheels;Bedside commode;Hand held shower head;Wheelchair - manual          Prior Functioning/Environment Level of Independence: Needs assistance  Gait / Transfers Assistance Needed: supervision with short rollator walker indoor surfaces (family uses wheelchair for exiting their home and for appts) ADL's / Homemaking Assistance Needed: family prepares meals; sponge bathes         OT Diagnosis: Generalized weakness   OT Problem List: Decreased strength;Decreased knowledge of precautions;Decreased knowledge of use of DME or AE;Impaired balance (sitting and/or standing)   OT Treatment/Interventions: Self-care/ADL training;Therapeutic exercise;DME and/or AE instruction;Therapeutic activities;Patient/family education;Balance training    OT Goals(Current goals can be found in the care plan section) Acute Rehab OT Goals Patient Stated Goal: not stated OT Goal Formulation: With patient Time For Goal Achievement: 05/22/14 Potential to Achieve Goals: Good ADL Goals Pt Will Perform Lower Body Dressing: with set-up;sit to/from stand Pt Will Transfer to Toilet: ambulating;with supervision (3 in 1 over commode) Pt Will Perform Toileting - Clothing Manipulation and hygiene: with modified independence;sit to/from stand  OT Frequency: Min 2X/week   Barriers to D/C:            Co-evaluation              End of Session Equipment Utilized During Treatment: Gait belt;Rolling walker  Activity Tolerance: Patient tolerated treatment well Patient left: in chair;with call bell/phone within reach;with family/visitor present   Time: 1122-1150 OT Time Calculation (min): 28 min Charges:  OT General Charges $OT Visit: 1 Procedure OT Evaluation $Initial OT Evaluation Tier I: 1 Procedure OT Treatments $Self Care/Home Management : 8-22 mins G-Codes: OT G-codes **NOT FOR INPATIENT CLASS** Functional Assessment Tool Used: clinical judgment Functional Limitation: Self care Self Care Current Status (Z0258): At least 20 percent but less than 40 percent impaired, limited or restricted Self Care Goal Status (N2778): At least 1 percent but less than 20 percent impaired, limited or restricted  Benito Mccreedy OTR/L [phone] 05/15/2014, 2:16 PM

## 2014-05-15 NOTE — Progress Notes (Addendum)
Patient Demographics  [name], is a 80 y.o. female, DOB - [birth year], GQB:[id number]  Admit date - 05/14/2014   Admitting Physician [name], MD  Outpatient Primary MD for the patient is [name]., MD  LOS - 1   Chief Complaint  Patient presents with  . Code Stroke        Subjective:   [name] today has, No headache, No chest pain, No abdominal pain - No Nausea, No new weakness tingling or numbness, No Cough - SOB. He is close to baseline  Assessment & Plan    1. Excessive aphasia in a patient with history of stroke and chronic right-sided hemiparesis. Symptoms resolved, head CT and MRI/MRA brain nonacute. Pending echo-carotid-PT-OT and speech eval. Switch to Eliquis from aspirin and Plavix along with statin for secondary prevention per neuro MD today.   2. Dyslipidemia. On statin, LDL less than 70.    3. Chronic atrial fibrillation. Goal rate control, continue beta blocker, on NOAC agent per Neuro MD today.    4. Essential hypertension. Blood pressure stable. Continue beta blocker and Ace comminution. Also on Aldactone.    5. Gout. No acute issue continue allopurinol.    6. GERD. Continue PPI     Code Status: full  Family Communication: daughter bedside  Disposition Plan: home   Procedures CT head, MRI MRI brain, pending echo and carotid duplex   Consults      Medications  Scheduled Meds: . allopurinol  100 mg Oral Daily  . aspirin EC  81 mg Oral Daily  . clopidogrel  75 mg Oral Daily  . docusate sodium  200 mg Oral BID  . enoxaparin (LOVENOX) injection  40 mg Subcutaneous Q24H  . lisinopril  2.5 mg Oral Daily  . metoprolol succinate  25 mg Oral Daily  . pantoprazole  40 mg Oral Daily  . pravastatin  40 mg Oral Daily  .  spironolactone  25 mg Oral Daily   Continuous Infusions:  PRN Meds:.ALPRAZolam  DVT Prophylaxis   Lovenox  Lab Results  Component Value Date   PLT 165 05/14/2014    Antibiotics     Anti-infectives    None          Objective:   Filed Vitals:   05/15/14 0521 05/15/14 0929 05/15/14 1013 05/15/14 1031  BP: 118/60 110/71 100/45 110/71  Pulse: 56 68 52 68  Temp: 98.1 F (36.7 C)  97.9 F (36.6 C)   TempSrc: Axillary  Oral   Resp: 20  20   Height:      Weight:      SpO2: 98%  97%     Wt Readings from Last 3 Encounters:  05/14/14 62.506 kg (137 lb 12.8 oz)  12/14/12 56.246 kg (124 lb)     Intake/Output Summary (Last 24 hours) at 05/15/14 1116 Last data filed at 05/15/14 0907  Gross per 24 hour  Intake    240 ml  Output    400 ml  Net   -160 ml     Physical Exam  Awake Alert, Oriented X 3, No new F.N deficits , Chr R sided weakness, Normal affect [place].AT,PERRAL Supple Neck,No JVD, No cervical lymphadenopathy appriciated.  Symmetrical Chest wall movement, Good air movement  bilaterally, CTAB  RRR,No Gallops,Rubs or new Murmurs, No Parasternal Heave +ve B.Sounds, Abd Soft, No tenderness, No organomegaly appriciated, No rebound - guarding or rigidity. No Cyanosis, Clubbing or edema, No new Rash or bruise      Data Review   Micro Results No results found for this or any previous visit (from the past 240 hour(s)).  Radiology Reports Ct Head (brain) Wo Contrast  05/14/2014   CLINICAL DATA:  Acute right-sided weakness. History of CVA. History of breast cancer. Code stroke. Initial encounter.  EXAM: CT HEAD WITHOUT CONTRAST  TECHNIQUE: Contiguous axial images were obtained from the base of the skull through the vertex without intravenous contrast.  COMPARISON:  04/30/2011; brain MRI - 04/30/2011  FINDINGS: Re- demonstrated advanced atrophy with centralized volume loss and commensurate expected dilatation of the ventricular system. Scattered periventricular  hypodensities compatible with microvascular ischemic disease. Old infarcts involving the left parietal lobe (image 20, series 2) and cerebellum (image 13, series 2). Given extensive background parenchymal abnormalities, there is no CT evidence of acute large territory infarct. No intraparenchymal or extra-axial mass or hemorrhage. Unchanged size and configuration of the ventricles and basilar cisterns. No midline shift. Intracranial atherosclerosis. Limited visualization of the paranasal sinuses and mastoid air cells are normal. No air-fluid levels. Regional soft tissues appear normal. Post bilateral cataract surgery.  IMPRESSION: 1. No acute intracranial process. 2. Advanced atrophy and microvascular ischemic disease and sequela of prior infarcts involving the left parietal lobe and cerebellum. Critical Value/emergent results were called by telephone at the time of interpretation on 05/14/2014 at 11:52 am to Dr. Nicole Kindred, who verbally acknowledged these results.   Electronically Signed   By: Sandi Mariscal M.D.   On: 05/14/2014 11:55   Mr Brain Wo Contrast  05/14/2014   CLINICAL DATA:  Sudden onset of slurred speech and confusion this morning. The symptoms have returned to baseline. The patient has some expressive aphasia and right-sided weakness at baseline related to a previous left MCA infarct.  EXAM: MRI HEAD WITHOUT CONTRAST  MRA HEAD WITHOUT CONTRAST  TECHNIQUE: Multiplanar, multiecho pulse sequences of the brain and surrounding structures were obtained without intravenous contrast. Angiographic images of the head were obtained using MRA technique without contrast.  COMPARISON:  CT head from the same day. MRI of the brain and MRA circle of Willis 04/30/2011.  FINDINGS: MRI HEAD FINDINGS  The diffusion-weighted images demonstrate no evidence for acute or subacute infarction. Chronic atrophy and extensive periventricular and subcortical white matter changes bilaterally are similar to the prior studies. There is  marked thinning of the white matter in the posterior parietal and occipital lobes. A remote superior left cerebellar infarct is evident. A remote posterior left frontal lobe infarct is evident. A persistent cavum septum pellucidum et vergae is noted. A small lipoma along the tentorium is stable.  Flow is present in the major intracranial arteries. The patient is status post bilateral lens extractions. Midline structures are within normal limits. The globes and orbits are intact. Mild mucosal thickening is present in the right anterior paranasal sinuses. No obstructing lesion is evident. The paranasal sinuses and mastoid air cells are otherwise clear.  MRA HEAD FINDINGS  Tortuosity of the cervical internal carotid arteries is again noted. The internal carotid arteries demonstrate no significant stenosis to the ICA termini. The A1 and M1 segments are normal. No definite anterior communicating artery is present. The MCA bifurcations are intact. There is some attenuation of ACA and MCA branch vessels, similar to the prior study.  The vertebral arteries are codominant. The PICA origins are visualized and normal. The basilar artery is small but without focal stenosis. Both posterior cerebral arteries arise from posterior communicating arteries and small P1 segments. The right P1 segment is smaller, as before. There is some attenuation of distal PCA branch vessels, worse on the left, also similar to the prior exam.  IMPRESSION: 1. No acute intracranial abnormality. 2. Advanced atrophy and diffuse white matter change is similar to the prior studies. 3. Remote infarcts involving the left superior cerebellum and posterior left frontal lobe. 4. Mild the right anterior paranasal sinus disease. 5. Mild to moderate diffuse medium and small vessel disease is similar the prior exam.   Electronically Signed   By: Lawrence Santiago M.D.   On: 05/14/2014 15:46   Mr Jodene Nam Head/brain Wo Cm  05/14/2014   CLINICAL DATA:  Sudden onset of  slurred speech and confusion this morning. The symptoms have returned to baseline. The patient has some expressive aphasia and right-sided weakness at baseline related to a previous left MCA infarct.  EXAM: MRI HEAD WITHOUT CONTRAST  MRA HEAD WITHOUT CONTRAST  TECHNIQUE: Multiplanar, multiecho pulse sequences of the brain and surrounding structures were obtained without intravenous contrast. Angiographic images of the head were obtained using MRA technique without contrast.  COMPARISON:  CT head from the same day. MRI of the brain and MRA circle of Willis 04/30/2011.  FINDINGS: MRI HEAD FINDINGS  The diffusion-weighted images demonstrate no evidence for acute or subacute infarction. Chronic atrophy and extensive periventricular and subcortical white matter changes bilaterally are similar to the prior studies. There is marked thinning of the white matter in the posterior parietal and occipital lobes. A remote superior left cerebellar infarct is evident. A remote posterior left frontal lobe infarct is evident. A persistent cavum septum pellucidum et vergae is noted. A small lipoma along the tentorium is stable.  Flow is present in the major intracranial arteries. The patient is status post bilateral lens extractions. Midline structures are within normal limits. The globes and orbits are intact. Mild mucosal thickening is present in the right anterior paranasal sinuses. No obstructing lesion is evident. The paranasal sinuses and mastoid air cells are otherwise clear.  MRA HEAD FINDINGS  Tortuosity of the cervical internal carotid arteries is again noted. The internal carotid arteries demonstrate no significant stenosis to the ICA termini. The A1 and M1 segments are normal. No definite anterior communicating artery is present. The MCA bifurcations are intact. There is some attenuation of ACA and MCA branch vessels, similar to the prior study.  The vertebral arteries are codominant. The PICA origins are visualized and  normal. The basilar artery is small but without focal stenosis. Both posterior cerebral arteries arise from posterior communicating arteries and small P1 segments. The right P1 segment is smaller, as before. There is some attenuation of distal PCA branch vessels, worse on the left, also similar to the prior exam.  IMPRESSION: 1. No acute intracranial abnormality. 2. Advanced atrophy and diffuse white matter change is similar to the prior studies. 3. Remote infarcts involving the left superior cerebellum and posterior left frontal lobe. 4. Mild the right anterior paranasal sinus disease. 5. Mild to moderate diffuse medium and small vessel disease is similar the prior exam.   Electronically Signed   By: Lawrence Santiago M.D.   On: 05/14/2014 15:46     CBC  Recent Labs Lab 05/14/14 1132  WBC 7.3  HGB 14.1  HCT 42.3  PLT 165  MCV 82.9  MCH 27.6  MCHC 33.3  RDW 14.1  LYMPHSABS 2.7  MONOABS 0.7  EOSABS 0.4  BASOSABS 0.0    Chemistries   Recent Labs Lab 05/14/14 1132  NA 144  K 3.7  CL 102  CO2 25  GLUCOSE 111*  BUN 19  CREATININE 0.98  CALCIUM 10.2  AST 25  ALT 17  ALKPHOS 100  BILITOT 0.5   ------------------------------------------------------------------------------------------------------------------ estimated creatinine clearance is 33.7 mL/min (by C-G formula based on Cr of 0.98). ------------------------------------------------------------------------------------------------------------------ No results for input(s): HGBA1C in the last 72 hours. ------------------------------------------------------------------------------------------------------------------  Recent Labs  05/15/14 0640  CHOL 105  HDL 40  LDLCALC 46  TRIG 93  CHOLHDL 2.6   ------------------------------------------------------------------------------------------------------------------ No results for input(s): TSH, T4TOTAL, T3FREE, THYROIDAB in the last 72 hours.  Invalid input(s):  FREET3 ------------------------------------------------------------------------------------------------------------------ No results for input(s): VITAMINB12, FOLATE, FERRITIN, TIBC, IRON, RETICCTPCT in the last 72 hours.  Coagulation profile  Recent Labs Lab 05/14/14 1132  INR 1.11    No results for input(s): DDIMER in the last 72 hours.  Cardiac Enzymes No results for input(s): CKMB, TROPONINI, MYOGLOBIN in the last 168 hours.  Invalid input(s): CK ------------------------------------------------------------------------------------------------------------------ Invalid input(s): POCBNP     Time Spent in minutes   35   SINGH,PRASHANT K M.D on 05/15/2014 at 11:16 AM  Between 7am to 7pm - Pager - [phone]  After 7pm go to [email or web address] - password [email or web address]  And look for the night coverage person covering for me after hours  [facility] Office  [phone]

## 2014-05-15 NOTE — Evaluation (Signed)
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   Clinical Impression  Patient with h/o of HTN, CHF, h/o breast cancer, CVA, and a-fib who sustained a fall in her bathroom at home with suspected CVA.  She presents with weakness in bil hip flexors, decr trunk stability in sitting and decr functional mobility.  She needs assistance with transfers and gait, cues for walker safety. Patient may benefit from skilled therapy to address these deficits on acute care level of care in order to discharge home with family assistance.      Follow Up Recommendations Home health PT;Outpatient PT;Supervision/Assistance - 24 hour    Equipment Recommendations  None recommended by PT    Recommendations for Other Services       Precautions / Restrictions Precautions Precautions: Fall;Other (comment) (Hard of hearing) Restrictions Weight Bearing Restrictions: No      Mobility  Bed Mobility Overal bed mobility: Needs Assistance Bed Mobility: Rolling;Sidelying to Sit Rolling: Min assist Sidelying to sit: Min assist (min cues for sequencing)          Transfers Overall transfer level: Needs assistance Equipment used: Standard walker Transfers: Sit to/from Stand;Stand Pivot Transfers Sit to Stand: Min assist (from bed) Stand pivot transfers: Min assist (with RW, cues for safety)          Ambulation/Gait Ambulation/Gait assistance: Min assist Ambulation Distance (Feet): 75 Feet Assistive device: Rolling walker (2 wheeled) (short walker) Gait Pattern/deviations: Shuffle;Trunk flexed;Wide base of support     General Gait Details: assistance to steer walker  Stairs            Wheelchair Mobility    Modified Rankin (Stroke Patients Only) Modified  Rankin (Stroke Patients Only) Pre-Morbid Rankin Score: Moderate disability Modified Rankin: Moderately severe disability     Balance Overall balance assessment: Needs assistance;History of Falls Sitting-balance support: Feet supported Sitting balance-Leahy Scale: Good Sitting balance - Comments: posterior LOB with perturbation   Standing balance support: Bilateral upper extremity supported Standing balance-Leahy Scale: Fair                               Pertinent Vitals/Pain Pain Assessment: No/denies pain    Home Living Family/patient expects to be discharged to:: Private residence Living Arrangements: Children Available Help at Discharge: Family Type of Home: Mobile home Home Access: Ramped entrance (with rail to front door)     Home Layout: One level Home Equipment: [place] - 4 wheels;Bedside commode;Hand held shower head;Wheelchair - manual      Prior Function Level of Independence: Needs assistance   Gait / Transfers Assistance Needed: supervision with short rollator walker indoor surfaces (family uses wheelchair for exiting their home and for appts)  ADL's / Homemaking Assistance Needed: family prepares meals        Hand Dominance   Dominant Hand: Right    Extremity/Trunk Assessment   Upper Extremity Assessment: Overall WFL for tasks assessed (4/5 shoulder flexion, abdct bil, 4+/5 elbow flexion/ext bil)           Lower Extremity Assessment: Overall WFL for tasks assessed (3+/5 bil hip flexion, 4+/5 bil knee ext, 4+/5 ankle DF bil)      Cervical / Trunk Assessment: Kyphotic  Communication   Communication: Expressive difficulties (  slight difficulty finding words, [facility] communication)  Cognition Arousal/Alertness: Awake/alert Behavior During Therapy: WFL for tasks assessed/performed Overall Cognitive Status: Within Functional Limits for tasks assessed                      General Comments      Exercises         Assessment/Plan    PT Assessment Patient needs continued PT services  PT Diagnosis Difficulty walking;Generalized weakness   PT Problem List Decreased strength;Decreased activity tolerance;Decreased balance;Decreased mobility;Decreased safety awareness  PT Treatment Interventions DME instruction;Gait training;Functional mobility training;Therapeutic activities;Therapeutic exercise;Balance training;Neuromuscular re-education;Patient/family education   PT Goals (Current goals can be found in the Care Plan section) Acute Rehab PT Goals Patient Stated Goal: go home PT Goal Formulation: With patient/family Time For Goal Achievement: 05/21/14 Potential to Achieve Goals: Good    Frequency Min 4X/week   Barriers to discharge   family may look into getting fall alert system for home    Co-evaluation               End of Session Equipment Utilized During Treatment: Gait belt Activity Tolerance: Patient limited by fatigue;Patient tolerated treatment well Patient left: in chair;with call bell/phone within reach;with family/visitor present (2 sons and dau in law present) Nurse Communication: Mobility status    Functional Assessment Tool Used: gait distance, transfers Functional Limitation: Mobility: Walking and moving around Mobility: Walking and Moving Around Current Status ([id number]): At least 20 percent but less than 40 percent impaired, limited or restricted Mobility: Walking and Moving Around Goal Status [phone]): At least 20 percent but less than 40 percent impaired, limited or restricted    Time: 0938-1020 PT Time Calculation (min): 42 min   Charges:   PT Evaluation $Initial PT Evaluation Tier I: 1 Procedure PT Treatments $Gait Training: 8-22 mins   PT G Codes:   Functional Assessment Tool Used: gait distance, transfers Functional Limitation: Mobility: Walking and moving around    Yesica Kemler 05/15/2014, 10:55 AM Malka So, [place]

## 2014-05-15 NOTE — Plan of Care (Signed)
Problem: Acute Treatment Outcomes Goal: Neuro exam at baseline or improved Outcome: Progressing Goal: BP within ordered parameters Outcome: Progressing Goal: Airway maintained/protected Outcome: Progressing Goal: 02 Sats > 94% Outcome: Progressing

## 2014-05-15 NOTE — Progress Notes (Signed)
Echocardiogram 2D Echocardiogram has been performed.  [name] 05/15/2014, 1:54 PM

## 2014-05-16 LAB — T4, FREE: Free T4: 1.11 ng/dL (ref 0.80–1.80)

## 2014-05-16 LAB — HEMOGLOBIN A1C
HEMOGLOBIN A1C: 6 % — AB (ref <5.7)
Mean Plasma Glucose: 126 mg/dL — ABNORMAL HIGH (ref <117)

## 2014-05-16 MED ORDER — APIXABAN 5 MG PO TABS: 5.0000 mg | ORAL_TABLET | Freq: Two times a day (BID) | ORAL | Status: AC

## 2014-05-16 NOTE — Progress Notes (Signed)
Talked to patient about [facility] choices, patient chose [facility]; Miranda with [facility] called for arrangements; Aneta Mins [phone]

## 2014-05-16 NOTE — Evaluation (Signed)
Speech Language Pathology Evaluation Patient Details Name: [name] MRN: [id number] DOB: 31-Jul-1926 Today's Date: 05/16/2014 Time: 3976-7341 SLP Time Calculation (min): 13 min  Problem List:  Patient Active Problem List   Diagnosis Date Noted  . TIA (transient ischemic attack) 05/14/2014  . Essential hypertension 05/14/2014  . Aphasia 05/14/2014  . Atrial fibrillation, unspecified 05/14/2014  . Congestive heart failure of unknown etiology 05/14/2014   Past Medical History:  Past Medical History  Diagnosis Date  . Hypertension   . CHF (congestive heart failure)   . History of breast cancer in female   . CVA (cerebral infarction)   . Atrial fibrillation, chronic    Past Surgical History:  Past Surgical History  Procedure Laterality Date  . Abdominal hysterectomy    . Cholecystectomy    . Mastectomy      right  . Hemorroidectomy     HPI:  80 y.o. female with a history of atrial fibrillation on anticoagulation, hypertension and hyperlipidemia, as well as previous left cerebral infarction, presenting with worsening of right-sided weakness and expressive aphasia.  MRI shows no acute changes.     Assessment / Plan / Recommendation Clinical Impression  Pt presents with functional expressive/receptive language with intact repetition, mild baseline dysfluency, intact naming and comprehension.  Reading is functional and at baseline.  Pt and family report being back at baseline re: communication.  Subtle dysarthria present.  No SLP f/u is warranted.  Will sign off.     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)   Cognition  Overall Cognitive Status: Within Advertising copywriter for tasks assessed    Comprehension  Auditory Comprehension Overall Auditory Comprehension: Appears within functional limits for tasks assessed Visual Recognition/Discrimination Discrimination: Within Function Limits Reading Comprehension Reading Status: Within funtional limits    Expression Expression Primary Mode of Expression: Verbal Verbal Expression Overall Verbal Expression: Appears within functional limits for tasks assessed Written Expression Dominant Hand: Right Written Expression: Not tested   Oral / Motor Oral Motor/Sensory Function Overall Oral Motor/Sensory Function: Impaired at baseline (mild right VII asymmetry) Motor Speech Overall Motor Speech: Appears within functional limits for tasks assessed   GO Functional Assessment Tool Used: portions of [place] Diagnostic Aphasia Exam Functional Limitations: Spoken language expressive Spoken Language Expression Current Status [phone]): At least 1 percent but less than 20 percent impaired, limited or restricted Spoken Language Expression Goal Status [phone]): At least 1 percent but less than 20 percent impaired, limited or restricted Spoken Language Expression Discharge Status [phone]): At least 1 percent but less than 20 percent impaired, limited or restricted   [name] [name] 05/16/2014, 9:54 AM

## 2014-05-16 NOTE — Discharge Instructions (Signed)
Follow with Primary MD [name]., MD in 7 days   Get CBC, CMP, 2 view Chest X ray checked  by Primary MD next visit.    Activity: As tolerated with Full fall precautions use walker/cane & assistance as needed   Disposition Home     Diet: Heart Healthy   with feeding assistance and aspiration precautions as needed.  For Heart failure patients - Check your Weight same time everyday, if you gain over 2 pounds, or you develop in leg swelling, experience more shortness of breath or chest pain, call your Primary MD immediately. Follow Cardiac Low Salt Diet and 1.8 lit/day fluid restriction.   On your next visit with your primary care physician please Get Medicines reviewed and adjusted.   Please request your Prim.MD to go over all Hospital Tests and Procedure/Radiological results at the follow up, please get all Hospital records sent to your Prim MD by signing hospital release before you go home.   If you experience worsening of your admission symptoms, develop shortness of breath, life threatening emergency, suicidal or homicidal thoughts you must seek medical attention immediately by calling 911 or calling your MD immediately  if symptoms less severe.  You Must read complete instructions/literature along with all the possible adverse reactions/side effects for all the Medicines you take and that have been prescribed to you. Take any new Medicines after you have completely understood and accpet all the possible adverse reactions/side effects.   Do not drive, operating heavy machinery, perform activities at heights, swimming or participation in water activities or provide baby sitting services if your were admitted for syncope or siezures until you have seen by Primary MD or a Neurologist and advised to do so again.  Do not drive when taking Pain medications.    Do not take more than prescribed Pain, Sleep and Anxiety Medications  Special Instructions: If you have smoked or  chewed Tobacco  in the last 2 yrs please stop smoking, stop any regular Alcohol  and or any Recreational drug use.  Wear Seat belts while driving.   Please note  You were cared for by a hospitalist during your hospital stay. If you have any questions about your discharge medications or the care you received while you were in the hospital after you are discharged, you can call the unit and asked to speak with the hospitalist on call if the hospitalist that took care of you is not available. Once you are discharged, your primary care physician will handle any further medical issues. Please note that NO REFILLS for any discharge medications will be authorized once you are discharged, as it is imperative that you return to your primary care physician (or establish a relationship with a primary care physician if you do not have one) for your aftercare needs so that they can reassess your need for medications and monitor your lab values.

## 2014-05-16 NOTE — Progress Notes (Signed)
Patient is discharged from room 4N05 at this time. Alert and in stable condition. IV site d/c'd as well as tele. Instructions read to patient and family and understanding verbalized. Left unit via wheelchair with all belongings.

## 2014-05-16 NOTE — Discharge Summary (Signed)
[name], is a 80 y.o. female  DOB May 28, 1927  MRN [id number].  Admission date:  05/14/2014  Admitting Physician  [name], MD  Discharge Date:  05/16/2014   Primary MD  [name]., MD  Recommendations for primary care physician for things to follow:   Monitor secondary to his factors for stroke   Admission Diagnosis  Aphasia [R47.01] CVA (cerebral infarction) [I63.9] Dysarthria [R47.1] Atrial fibrillation, unspecified [I48.91]   Discharge Diagnosis  Aphasia [R47.01] CVA (cerebral infarction) [I63.9] Dysarthria [R47.1] Atrial fibrillation, unspecified [I48.91]     Principal Problem:   TIA (transient ischemic attack) Active Problems:   Essential hypertension   Aphasia   Atrial fibrillation, unspecified   Congestive heart failure of unknown etiology      Past Medical History  Diagnosis Date  . Hypertension   . CHF (congestive heart failure)   . History of breast cancer in female   . CVA (cerebral infarction)   . Atrial fibrillation, chronic     Past Surgical History  Procedure Laterality Date  . Abdominal hysterectomy    . Cholecystectomy    . Mastectomy      right  . Hemorroidectomy         History of present illness and  Hospital Course:     Kindly see H&P for history of present illness and admission details, please review complete Labs, Consult reports and Test reports for all details in brief  HPI  from the history and physical done on the day of admission   [name] is a 80 y.o. female with a past medical history of hypertension, coronary artery disease status post stenting, unspecified congestive heart failure, breast cancer and CVA with expressive aphasia. The patient states that she is here for a fall however I've spoken with her son and obtained further detail in  regards to her history. He states that the patient was doing well this morning and was able eat her breakfast without any trouble. Around 9 to 10:00 in the morning he noticed that her speech became slurred and she appeared to become confused. This episode lasted for a good hour and was already improving prior to her coming to the hospital. By the time she arrived to the ER symptoms had resolved. Due to her prior CVA, she has difficulty with expressive aphasia and right-sided weakness. Her son states that he did not notice any worsening in the weakness during this morning's episode. The patient has no complaints. She does admit to having trouble speaking "during her fall" this morning but feels like her speech is back to normal. Based on EKG the patient is in atrial fibrillation. Her son states that she has no history of this in the past.   Hospital Course   1. Excessive aphasia in a patient with history of stroke and chronic right-sided hemiparesis. Symptoms resolved, head CT and MRI/MRA brain nonacute. Non acute echo-carotid-PT-OT and speech eval. Switched to Eliquis from aspirin and Plavix along with statin for secondary prevention per neuro MD  today.   2. Dyslipidemia. On statin, LDL less than 70.    3. Chronic atrial fibrillation. Goal rate control, continue beta blocker, on NOAC agent per Neuro MD today.    4. Essential hypertension. Blood pressure stable. Continue beta blocker and Ace , Also on Aldactone.    5. Gout. No acute issue continue allopurinol.    6. GERD. Continue PPI    7.chronic combined systolic and diastolic CHF. EF 45%. Currently compensated. Follow with PCP and primary cardiologist outpatient, on beta blocker Ace continue.   Discharge Condition: stable   Follow UP  Follow-up Information    Follow up with [name]., MD. Schedule an appointment as soon as possible for a visit in 1 week.   Specialty:  Internal Medicine   Contact information:    [place] [address] [phone]         Discharge Instructions  and  Discharge Medications      Discharge Instructions    Discharge instructions    Complete by:  As directed   Follow with Primary MD [name]., MD in 7 days   Get CBC, CMP, 2 view Chest X ray checked  by Primary MD next visit.    Activity: As tolerated with Full fall precautions use walker/cane & assistance as needed   Disposition Home     Diet: Heart Healthy   with feeding assistance and aspiration precautions as needed.  For Heart failure patients - Check your Weight same time everyday, if you gain over 2 pounds, or you develop in leg swelling, experience more shortness of breath or chest pain, call your Primary MD immediately. Follow Cardiac Low Salt Diet and 1.8 lit/day fluid restriction.   On your next visit with your primary care physician please Get Medicines reviewed and adjusted.   Please request your Prim.MD to go over all Hospital Tests and Procedure/Radiological results at the follow up, please get all Hospital records sent to your Prim MD by signing hospital release before you go home.   If you experience worsening of your admission symptoms, develop shortness of breath, life threatening emergency, suicidal or homicidal thoughts you must seek medical attention immediately by calling 911 or calling your MD immediately  if symptoms less severe.  You Must read complete instructions/literature along with all the possible adverse reactions/side effects for all the Medicines you take and that have been prescribed to you. Take any new Medicines after you have completely understood and accpet all the possible adverse reactions/side effects.   Do not drive, operating heavy machinery, perform activities at heights, swimming or participation in water activities or provide baby sitting services if your were admitted for syncope or siezures until you have seen by Primary  MD or a Neurologist and advised to do so again.  Do not drive when taking Pain medications.    Do not take more than prescribed Pain, Sleep and Anxiety Medications  Special Instructions: If you have smoked or chewed Tobacco  in the last 2 yrs please stop smoking, stop any regular Alcohol  and or any Recreational drug use.  Wear Seat belts while driving.   Please note  You were cared for by a hospitalist during your hospital stay. If you have any questions about your discharge medications or the care you received while you were in the hospital after you are discharged, you can call the unit and asked to speak with the hospitalist on call if the hospitalist that took care of  you is not available. Once you are discharged, your primary care physician will handle any further medical issues. Please note that NO REFILLS for any discharge medications will be authorized once you are discharged, as it is imperative that you return to your primary care physician (or establish a relationship with a primary care physician if you do not have one) for your aftercare needs so that they can reassess your need for medications and monitor your lab values.     Increase activity slowly    Complete by:  As directed             Medication List    STOP taking these medications        aspirin EC 81 MG tablet     clopidogrel 75 MG tablet  Commonly known as:  PLAVIX      TAKE these medications        acetaminophen 500 MG tablet  Commonly known as:  TYLENOL  Take 500 mg by mouth every 6 (six) hours as needed.     allopurinol 100 MG tablet  Commonly known as:  ZYLOPRIM  Take 100 mg by mouth daily.     ALPRAZolam 0.25 MG tablet  Commonly known as:  XANAX  Take 0.125-0.25 mg by mouth 2 (two) times daily as needed for anxiety.     amLODipine 10 MG tablet  Commonly known as:  NORVASC  Take 10 mg by mouth daily.     apixaban 5 MG Tabs tablet  Commonly known as:  ELIQUIS  Take 1 tablet (5 mg total)  by mouth 2 (two) times daily.     cholecalciferol 1000 UNITS tablet  Commonly known as:  VITAMIN D  Take 1,000 Units by mouth 2 (two) times daily.     docusate sodium 100 MG capsule  Commonly known as:  COLACE  Take 200 mg by mouth 2 (two) times daily.     hydrochlorothiazide 12.5 MG capsule  Commonly known as:  MICROZIDE  Take 12.5 mg by mouth daily as needed (for swelling).     lisinopril 2.5 MG tablet  Commonly known as:  PRINIVIL,ZESTRIL  Take 2.5 mg by mouth daily.     Melatonin 3 MG Tabs  Take 1 tablet by mouth at bedtime.     metoprolol succinate 25 MG 24 hr tablet  Commonly known as:  TOPROL-XL  Take 25 mg by mouth daily.     omeprazole 20 MG capsule  Commonly known as:  PRILOSEC  Take 20 mg by mouth daily.     pravastatin 40 MG tablet  Commonly known as:  PRAVACHOL  Take 40 mg by mouth daily.     spironolactone 25 MG tablet  Commonly known as:  ALDACTONE  Take 25 mg by mouth daily.     SYSTANE BALANCE OP  Apply 1 drop to eye daily.          Diet and Activity recommendation: See Discharge Instructions above   Consults obtained - Neuro   Major procedures and Radiology Reports - PLEASE review detailed and final reports for all details, in brief -       Ct Head (brain) Wo Contrast  05/14/2014   CLINICAL DATA:  Acute right-sided weakness. History of CVA. History of breast cancer. Code stroke. Initial encounter.  EXAM: CT HEAD WITHOUT CONTRAST  TECHNIQUE: Contiguous axial images were obtained from the base of the skull through the vertex without intravenous contrast.  COMPARISON:  04/30/2011; brain MRI - 04/30/2011  FINDINGS:  Re- demonstrated advanced atrophy with centralized volume loss and commensurate expected dilatation of the ventricular system. Scattered periventricular hypodensities compatible with microvascular ischemic disease. Old infarcts involving the left parietal lobe (image 20, series 2) and cerebellum (image 13, series 2). Given extensive  background parenchymal abnormalities, there is no CT evidence of acute large territory infarct. No intraparenchymal or extra-axial mass or hemorrhage. Unchanged size and configuration of the ventricles and basilar cisterns. No midline shift. Intracranial atherosclerosis. Limited visualization of the paranasal sinuses and mastoid air cells are normal. No air-fluid levels. Regional soft tissues appear normal. Post bilateral cataract surgery.  IMPRESSION: 1. No acute intracranial process. 2. Advanced atrophy and microvascular ischemic disease and sequela of prior infarcts involving the left parietal lobe and cerebellum. Critical Value/emergent results were called by telephone at the time of interpretation on 05/14/2014 at 11:52 am to Dr. Nicole Kindred, who verbally acknowledged these results.   Electronically Signed   By: Sandi Mariscal M.D.   On: 05/14/2014 11:55   Mr Brain Wo Contrast  05/14/2014   CLINICAL DATA:  Sudden onset of slurred speech and confusion this morning. The symptoms have returned to baseline. The patient has some expressive aphasia and right-sided weakness at baseline related to a previous left MCA infarct.  EXAM: MRI HEAD WITHOUT CONTRAST  MRA HEAD WITHOUT CONTRAST  TECHNIQUE: Multiplanar, multiecho pulse sequences of the brain and surrounding structures were obtained without intravenous contrast. Angiographic images of the head were obtained using MRA technique without contrast.  COMPARISON:  CT head from the same day. MRI of the brain and MRA circle of Willis 04/30/2011.  FINDINGS: MRI HEAD FINDINGS  The diffusion-weighted images demonstrate no evidence for acute or subacute infarction. Chronic atrophy and extensive periventricular and subcortical white matter changes bilaterally are similar to the prior studies. There is marked thinning of the white matter in the posterior parietal and occipital lobes. A remote superior left cerebellar infarct is evident. A remote posterior left frontal lobe infarct  is evident. A persistent cavum septum pellucidum et vergae is noted. A small lipoma along the tentorium is stable.  Flow is present in the major intracranial arteries. The patient is status post bilateral lens extractions. Midline structures are within normal limits. The globes and orbits are intact. Mild mucosal thickening is present in the right anterior paranasal sinuses. No obstructing lesion is evident. The paranasal sinuses and mastoid air cells are otherwise clear.  MRA HEAD FINDINGS  Tortuosity of the cervical internal carotid arteries is again noted. The internal carotid arteries demonstrate no significant stenosis to the ICA termini. The A1 and M1 segments are normal. No definite anterior communicating artery is present. The MCA bifurcations are intact. There is some attenuation of ACA and MCA branch vessels, similar to the prior study.  The vertebral arteries are codominant. The PICA origins are visualized and normal. The basilar artery is small but without focal stenosis. Both posterior cerebral arteries arise from posterior communicating arteries and small P1 segments. The right P1 segment is smaller, as before. There is some attenuation of distal PCA branch vessels, worse on the left, also similar to the prior exam.  IMPRESSION: 1. No acute intracranial abnormality. 2. Advanced atrophy and diffuse white matter change is similar to the prior studies. 3. Remote infarcts involving the left superior cerebellum and posterior left frontal lobe. 4. Mild the right anterior paranasal sinus disease. 5. Mild to moderate diffuse medium and small vessel disease is similar the prior exam.   Electronically Signed  By: Lawrence Santiago M.D.   On: 05/14/2014 15:46   Mr Jodene Nam Head/brain Wo Cm  05/14/2014   CLINICAL DATA:  Sudden onset of slurred speech and confusion this morning. The symptoms have returned to baseline. The patient has some expressive aphasia and right-sided weakness at baseline related to a previous  left MCA infarct.  EXAM: MRI HEAD WITHOUT CONTRAST  MRA HEAD WITHOUT CONTRAST  TECHNIQUE: Multiplanar, multiecho pulse sequences of the brain and surrounding structures were obtained without intravenous contrast. Angiographic images of the head were obtained using MRA technique without contrast.  COMPARISON:  CT head from the same day. MRI of the brain and MRA circle of Willis 04/30/2011.  FINDINGS: MRI HEAD FINDINGS  The diffusion-weighted images demonstrate no evidence for acute or subacute infarction. Chronic atrophy and extensive periventricular and subcortical white matter changes bilaterally are similar to the prior studies. There is marked thinning of the white matter in the posterior parietal and occipital lobes. A remote superior left cerebellar infarct is evident. A remote posterior left frontal lobe infarct is evident. A persistent cavum septum pellucidum et vergae is noted. A small lipoma along the tentorium is stable.  Flow is present in the major intracranial arteries. The patient is status post bilateral lens extractions. Midline structures are within normal limits. The globes and orbits are intact. Mild mucosal thickening is present in the right anterior paranasal sinuses. No obstructing lesion is evident. The paranasal sinuses and mastoid air cells are otherwise clear.  MRA HEAD FINDINGS  Tortuosity of the cervical internal carotid arteries is again noted. The internal carotid arteries demonstrate no significant stenosis to the ICA termini. The A1 and M1 segments are normal. No definite anterior communicating artery is present. The MCA bifurcations are intact. There is some attenuation of ACA and MCA branch vessels, similar to the prior study.  The vertebral arteries are codominant. The PICA origins are visualized and normal. The basilar artery is small but without focal stenosis. Both posterior cerebral arteries arise from posterior communicating arteries and small P1 segments. The right P1 segment  is smaller, as before. There is some attenuation of distal PCA branch vessels, worse on the left, also similar to the prior exam.  IMPRESSION: 1. No acute intracranial abnormality. 2. Advanced atrophy and diffuse white matter change is similar to the prior studies. 3. Remote infarcts involving the left superior cerebellum and posterior left frontal lobe. 4. Mild the right anterior paranasal sinus disease. 5. Mild to moderate diffuse medium and small vessel disease is similar the prior exam.   Electronically Signed   By: Lawrence Santiago M.D.   On: 05/14/2014 15:46    Carotid duplex completed.   Preliminary report: Bilateral: 1-39% ICA stenosis. Vertebral artery flow is antegrade.   TTE  - Left ventricle: The cavity size was normal. Wall thickness was increased increased in a pattern of mild to moderate LVH. Systolic function was mildly to moderately reduced. The estimated ejection fraction was in the range of 40% to 45%. Hypokinesis of the inferior myocardium. Doppler parameters are consistent with abnormal left ventricular relaxation (grade 1 diastolic dysfunction). - Aortic valve: Valve area (Vmax): 1.77 cm^2. - Mitral valve: Calcification noted in submitral structures There was mild to moderate regurgitation. - Right atrium: The atrium was mildly to moderately dilated. - Pulmonary arteries: Systolic pressure was mildly increased. PA peak pressure: 36 mm Hg (S).   Micro Results      No results found for this or any previous visit (from the  past 240 hour(s)).     Today   Subjective:   [name] today has no headache,no chest abdominal pain,no new weakness tingling or numbness, feels much better wants to go home today.   Objective:   Blood pressure 118/56, pulse 69, temperature 98.4 F (36.9 C), temperature source Oral, resp. rate 20, height 4\' 11"  (1.499 m), weight 62.506 kg (137 lb 12.8 oz), SpO2 98 %.   Intake/Output Summary (Last 24 hours) at 05/16/14  1003 Last data filed at 05/16/14 0935  Gross per 24 hour  Intake    220 ml  Output      0 ml  Net    220 ml    Exam Awake Alert, Oriented x 3, No new F.N deficits, Normal affect, Chr R sided weakness .AT,PERRAL Supple Neck,No JVD, No cervical lymphadenopathy appriciated.  Symmetrical Chest wall movement, Good air movement bilaterally, CTAB RRR,No Gallops,Rubs or new Murmurs, No Parasternal Heave +ve B.Sounds, Abd Soft, Non tender, No organomegaly appriciated, No rebound -guarding or rigidity. No Cyanosis, Clubbing or edema, No new Rash or bruise  Data Review   CBC w Diff: Lab Results  Component Value Date   WBC 7.3 05/14/2014   HGB 14.1 05/14/2014   HCT 42.3 05/14/2014   PLT 165 05/14/2014   LYMPHOPCT 37 05/14/2014   MONOPCT 10 05/14/2014   EOSPCT 6* 05/14/2014   BASOPCT 0 05/14/2014    CMP: Lab Results  Component Value Date   NA 144 05/14/2014   K 3.7 05/14/2014   CL 102 05/14/2014   CO2 25 05/14/2014   BUN 19 05/14/2014   CREATININE 0.98 05/14/2014   PROT 7.9 05/14/2014   ALBUMIN 3.9 05/14/2014   BILITOT 0.5 05/14/2014   ALKPHOS 100 05/14/2014   AST 25 05/14/2014   ALT 17 05/14/2014  . Lab Results  Component Value Date   CHOL 105 05/15/2014   HDL 40 05/15/2014   LDLCALC 46 05/15/2014   TRIG 93 05/15/2014   CHOLHDL 2.6 05/15/2014    Lab Results  Component Value Date   HGBA1C 6.0* 05/15/2014     Total Time in preparing paper work, data evaluation and todays exam - 35 minutes  Thurnell Lose M.D on 05/16/2014 at 10:03 AM  [facility] Office  [phone]

## 2014-07-15 ENCOUNTER — Other Ambulatory Visit: Payer: Self-pay

## 2014-07-15 DIAGNOSIS — Z1231 Encounter for screening mammogram for malignant neoplasm of breast: Secondary | ICD-10-CM

## 2014-07-15 DIAGNOSIS — Z9011 Acquired absence of right breast and nipple: Secondary | ICD-10-CM

## 2014-08-08 ENCOUNTER — Ambulatory Visit
Admission: RE | Admit: 2014-08-08 | Discharge: 2014-08-08 | Disposition: A | Discharge status: Home / Self Care | Payer: Medicare Other | Source: Ambulatory Visit

## 2014-08-08 DIAGNOSIS — Z1231 Encounter for screening mammogram for malignant neoplasm of breast: Secondary | ICD-10-CM | POA: Diagnosis not present

## 2014-08-08 DIAGNOSIS — Z9011 Acquired absence of right breast and nipple: Secondary | ICD-10-CM

## 2014-08-24 DIAGNOSIS — Z8673 Personal history of transient ischemic attack (TIA), and cerebral infarction without residual deficits: Secondary | ICD-10-CM | POA: Diagnosis not present

## 2014-08-24 DIAGNOSIS — I2510 Atherosclerotic heart disease of native coronary artery without angina pectoris: Secondary | ICD-10-CM | POA: Diagnosis not present

## 2014-08-24 DIAGNOSIS — I4891 Unspecified atrial fibrillation: Secondary | ICD-10-CM | POA: Diagnosis not present

## 2014-08-24 DIAGNOSIS — I429 Cardiomyopathy, unspecified: Secondary | ICD-10-CM | POA: Diagnosis not present

## 2014-09-08 DIAGNOSIS — I429 Cardiomyopathy, unspecified: Secondary | ICD-10-CM | POA: Diagnosis not present

## 2014-11-01 DIAGNOSIS — H31091 Other chorioretinal scars, right eye: Secondary | ICD-10-CM | POA: Diagnosis not present

## 2014-11-01 DIAGNOSIS — Z961 Presence of intraocular lens: Secondary | ICD-10-CM | POA: Diagnosis not present

## 2014-11-01 DIAGNOSIS — H1851 Endothelial corneal dystrophy: Secondary | ICD-10-CM | POA: Diagnosis not present

## 2014-11-01 DIAGNOSIS — H53002 Unspecified amblyopia, left eye: Secondary | ICD-10-CM | POA: Diagnosis not present

## 2014-12-08 DIAGNOSIS — I10 Essential (primary) hypertension: Secondary | ICD-10-CM | POA: Diagnosis not present

## 2014-12-08 DIAGNOSIS — E784 Other hyperlipidemia: Secondary | ICD-10-CM | POA: Diagnosis not present

## 2014-12-08 DIAGNOSIS — E559 Vitamin D deficiency, unspecified: Secondary | ICD-10-CM | POA: Diagnosis not present

## 2014-12-08 DIAGNOSIS — R7309 Other abnormal glucose: Secondary | ICD-10-CM | POA: Diagnosis not present

## 2015-01-24 DIAGNOSIS — E785 Hyperlipidemia, unspecified: Secondary | ICD-10-CM | POA: Diagnosis not present

## 2015-01-24 DIAGNOSIS — M19041 Primary osteoarthritis, right hand: Secondary | ICD-10-CM | POA: Diagnosis not present

## 2015-01-24 DIAGNOSIS — R471 Dysarthria and anarthria: Secondary | ICD-10-CM | POA: Diagnosis not present

## 2015-01-24 DIAGNOSIS — D649 Anemia, unspecified: Secondary | ICD-10-CM | POA: Diagnosis not present

## 2015-01-24 DIAGNOSIS — Z9181 History of falling: Secondary | ICD-10-CM | POA: Diagnosis not present

## 2015-01-24 DIAGNOSIS — M1009 Idiopathic gout, multiple sites: Secondary | ICD-10-CM | POA: Diagnosis not present

## 2015-01-24 DIAGNOSIS — M170 Bilateral primary osteoarthritis of knee: Secondary | ICD-10-CM | POA: Diagnosis not present

## 2015-01-24 DIAGNOSIS — H9190 Unspecified hearing loss, unspecified ear: Secondary | ICD-10-CM | POA: Diagnosis not present

## 2015-01-24 DIAGNOSIS — M19042 Primary osteoarthritis, left hand: Secondary | ICD-10-CM | POA: Diagnosis not present

## 2015-01-24 DIAGNOSIS — E559 Vitamin D deficiency, unspecified: Secondary | ICD-10-CM | POA: Diagnosis not present

## 2015-01-24 DIAGNOSIS — I69354 Hemiplegia and hemiparesis following cerebral infarction affecting left non-dominant side: Secondary | ICD-10-CM | POA: Diagnosis not present

## 2015-01-24 DIAGNOSIS — Z7901 Long term (current) use of anticoagulants: Secondary | ICD-10-CM | POA: Diagnosis not present

## 2015-01-24 DIAGNOSIS — I10 Essential (primary) hypertension: Secondary | ICD-10-CM | POA: Diagnosis not present

## 2015-01-26 DIAGNOSIS — R471 Dysarthria and anarthria: Secondary | ICD-10-CM | POA: Diagnosis not present

## 2015-01-26 DIAGNOSIS — D649 Anemia, unspecified: Secondary | ICD-10-CM | POA: Diagnosis not present

## 2015-01-26 DIAGNOSIS — M19042 Primary osteoarthritis, left hand: Secondary | ICD-10-CM | POA: Diagnosis not present

## 2015-01-26 DIAGNOSIS — Z7901 Long term (current) use of anticoagulants: Secondary | ICD-10-CM | POA: Diagnosis not present

## 2015-01-26 DIAGNOSIS — E785 Hyperlipidemia, unspecified: Secondary | ICD-10-CM | POA: Diagnosis not present

## 2015-01-26 DIAGNOSIS — M1009 Idiopathic gout, multiple sites: Secondary | ICD-10-CM | POA: Diagnosis not present

## 2015-01-26 DIAGNOSIS — Z9181 History of falling: Secondary | ICD-10-CM | POA: Diagnosis not present

## 2015-01-26 DIAGNOSIS — M170 Bilateral primary osteoarthritis of knee: Secondary | ICD-10-CM | POA: Diagnosis not present

## 2015-01-26 DIAGNOSIS — E559 Vitamin D deficiency, unspecified: Secondary | ICD-10-CM | POA: Diagnosis not present

## 2015-01-26 DIAGNOSIS — M19041 Primary osteoarthritis, right hand: Secondary | ICD-10-CM | POA: Diagnosis not present

## 2015-01-26 DIAGNOSIS — H9190 Unspecified hearing loss, unspecified ear: Secondary | ICD-10-CM | POA: Diagnosis not present

## 2015-01-26 DIAGNOSIS — I10 Essential (primary) hypertension: Secondary | ICD-10-CM | POA: Diagnosis not present

## 2015-01-26 DIAGNOSIS — I69354 Hemiplegia and hemiparesis following cerebral infarction affecting left non-dominant side: Secondary | ICD-10-CM | POA: Diagnosis not present

## 2015-01-28 DIAGNOSIS — I10 Essential (primary) hypertension: Secondary | ICD-10-CM | POA: Diagnosis not present

## 2015-01-28 DIAGNOSIS — E785 Hyperlipidemia, unspecified: Secondary | ICD-10-CM | POA: Diagnosis not present

## 2015-01-28 DIAGNOSIS — Z9181 History of falling: Secondary | ICD-10-CM | POA: Diagnosis not present

## 2015-01-28 DIAGNOSIS — I69354 Hemiplegia and hemiparesis following cerebral infarction affecting left non-dominant side: Secondary | ICD-10-CM | POA: Diagnosis not present

## 2015-01-28 DIAGNOSIS — D649 Anemia, unspecified: Secondary | ICD-10-CM | POA: Diagnosis not present

## 2015-01-28 DIAGNOSIS — R471 Dysarthria and anarthria: Secondary | ICD-10-CM | POA: Diagnosis not present

## 2015-01-28 DIAGNOSIS — M19041 Primary osteoarthritis, right hand: Secondary | ICD-10-CM | POA: Diagnosis not present

## 2015-01-28 DIAGNOSIS — Z7901 Long term (current) use of anticoagulants: Secondary | ICD-10-CM | POA: Diagnosis not present

## 2015-01-28 DIAGNOSIS — E559 Vitamin D deficiency, unspecified: Secondary | ICD-10-CM | POA: Diagnosis not present

## 2015-01-28 DIAGNOSIS — M1009 Idiopathic gout, multiple sites: Secondary | ICD-10-CM | POA: Diagnosis not present

## 2015-01-28 DIAGNOSIS — M19042 Primary osteoarthritis, left hand: Secondary | ICD-10-CM | POA: Diagnosis not present

## 2015-01-28 DIAGNOSIS — M170 Bilateral primary osteoarthritis of knee: Secondary | ICD-10-CM | POA: Diagnosis not present

## 2015-01-28 DIAGNOSIS — H9190 Unspecified hearing loss, unspecified ear: Secondary | ICD-10-CM | POA: Diagnosis not present

## 2015-01-30 DIAGNOSIS — I10 Essential (primary) hypertension: Secondary | ICD-10-CM | POA: Diagnosis not present

## 2015-01-30 DIAGNOSIS — H9190 Unspecified hearing loss, unspecified ear: Secondary | ICD-10-CM | POA: Diagnosis not present

## 2015-01-30 DIAGNOSIS — E559 Vitamin D deficiency, unspecified: Secondary | ICD-10-CM | POA: Diagnosis not present

## 2015-01-30 DIAGNOSIS — E785 Hyperlipidemia, unspecified: Secondary | ICD-10-CM | POA: Diagnosis not present

## 2015-01-30 DIAGNOSIS — M19042 Primary osteoarthritis, left hand: Secondary | ICD-10-CM | POA: Diagnosis not present

## 2015-01-30 DIAGNOSIS — D649 Anemia, unspecified: Secondary | ICD-10-CM | POA: Diagnosis not present

## 2015-01-30 DIAGNOSIS — M1009 Idiopathic gout, multiple sites: Secondary | ICD-10-CM | POA: Diagnosis not present

## 2015-01-30 DIAGNOSIS — I69354 Hemiplegia and hemiparesis following cerebral infarction affecting left non-dominant side: Secondary | ICD-10-CM | POA: Diagnosis not present

## 2015-01-30 DIAGNOSIS — M19041 Primary osteoarthritis, right hand: Secondary | ICD-10-CM | POA: Diagnosis not present

## 2015-01-30 DIAGNOSIS — R471 Dysarthria and anarthria: Secondary | ICD-10-CM | POA: Diagnosis not present

## 2015-01-30 DIAGNOSIS — Z7901 Long term (current) use of anticoagulants: Secondary | ICD-10-CM | POA: Diagnosis not present

## 2015-01-30 DIAGNOSIS — Z9181 History of falling: Secondary | ICD-10-CM | POA: Diagnosis not present

## 2015-01-30 DIAGNOSIS — M170 Bilateral primary osteoarthritis of knee: Secondary | ICD-10-CM | POA: Diagnosis not present

## 2015-01-31 DIAGNOSIS — H9190 Unspecified hearing loss, unspecified ear: Secondary | ICD-10-CM | POA: Diagnosis not present

## 2015-01-31 DIAGNOSIS — Z7901 Long term (current) use of anticoagulants: Secondary | ICD-10-CM | POA: Diagnosis not present

## 2015-01-31 DIAGNOSIS — I69354 Hemiplegia and hemiparesis following cerebral infarction affecting left non-dominant side: Secondary | ICD-10-CM | POA: Diagnosis not present

## 2015-01-31 DIAGNOSIS — M1009 Idiopathic gout, multiple sites: Secondary | ICD-10-CM | POA: Diagnosis not present

## 2015-01-31 DIAGNOSIS — M19042 Primary osteoarthritis, left hand: Secondary | ICD-10-CM | POA: Diagnosis not present

## 2015-01-31 DIAGNOSIS — M170 Bilateral primary osteoarthritis of knee: Secondary | ICD-10-CM | POA: Diagnosis not present

## 2015-01-31 DIAGNOSIS — Z9181 History of falling: Secondary | ICD-10-CM | POA: Diagnosis not present

## 2015-01-31 DIAGNOSIS — I10 Essential (primary) hypertension: Secondary | ICD-10-CM | POA: Diagnosis not present

## 2015-01-31 DIAGNOSIS — M19041 Primary osteoarthritis, right hand: Secondary | ICD-10-CM | POA: Diagnosis not present

## 2015-01-31 DIAGNOSIS — E559 Vitamin D deficiency, unspecified: Secondary | ICD-10-CM | POA: Diagnosis not present

## 2015-01-31 DIAGNOSIS — D649 Anemia, unspecified: Secondary | ICD-10-CM | POA: Diagnosis not present

## 2015-01-31 DIAGNOSIS — R471 Dysarthria and anarthria: Secondary | ICD-10-CM | POA: Diagnosis not present

## 2015-01-31 DIAGNOSIS — E785 Hyperlipidemia, unspecified: Secondary | ICD-10-CM | POA: Diagnosis not present

## 2015-02-01 DIAGNOSIS — R471 Dysarthria and anarthria: Secondary | ICD-10-CM | POA: Diagnosis not present

## 2015-02-01 DIAGNOSIS — M170 Bilateral primary osteoarthritis of knee: Secondary | ICD-10-CM | POA: Diagnosis not present

## 2015-02-01 DIAGNOSIS — I10 Essential (primary) hypertension: Secondary | ICD-10-CM | POA: Diagnosis not present

## 2015-02-01 DIAGNOSIS — M19041 Primary osteoarthritis, right hand: Secondary | ICD-10-CM | POA: Diagnosis not present

## 2015-02-01 DIAGNOSIS — H9190 Unspecified hearing loss, unspecified ear: Secondary | ICD-10-CM | POA: Diagnosis not present

## 2015-02-01 DIAGNOSIS — E559 Vitamin D deficiency, unspecified: Secondary | ICD-10-CM | POA: Diagnosis not present

## 2015-02-01 DIAGNOSIS — M1009 Idiopathic gout, multiple sites: Secondary | ICD-10-CM | POA: Diagnosis not present

## 2015-02-01 DIAGNOSIS — I69354 Hemiplegia and hemiparesis following cerebral infarction affecting left non-dominant side: Secondary | ICD-10-CM | POA: Diagnosis not present

## 2015-02-01 DIAGNOSIS — Z9181 History of falling: Secondary | ICD-10-CM | POA: Diagnosis not present

## 2015-02-01 DIAGNOSIS — Z7901 Long term (current) use of anticoagulants: Secondary | ICD-10-CM | POA: Diagnosis not present

## 2015-02-01 DIAGNOSIS — E785 Hyperlipidemia, unspecified: Secondary | ICD-10-CM | POA: Diagnosis not present

## 2015-02-01 DIAGNOSIS — D649 Anemia, unspecified: Secondary | ICD-10-CM | POA: Diagnosis not present

## 2015-02-01 DIAGNOSIS — M19042 Primary osteoarthritis, left hand: Secondary | ICD-10-CM | POA: Diagnosis not present

## 2015-02-02 DIAGNOSIS — R471 Dysarthria and anarthria: Secondary | ICD-10-CM | POA: Diagnosis not present

## 2015-02-02 DIAGNOSIS — E785 Hyperlipidemia, unspecified: Secondary | ICD-10-CM | POA: Diagnosis not present

## 2015-02-02 DIAGNOSIS — M170 Bilateral primary osteoarthritis of knee: Secondary | ICD-10-CM | POA: Diagnosis not present

## 2015-02-02 DIAGNOSIS — Z7901 Long term (current) use of anticoagulants: Secondary | ICD-10-CM | POA: Diagnosis not present

## 2015-02-02 DIAGNOSIS — I69354 Hemiplegia and hemiparesis following cerebral infarction affecting left non-dominant side: Secondary | ICD-10-CM | POA: Diagnosis not present

## 2015-02-02 DIAGNOSIS — M1009 Idiopathic gout, multiple sites: Secondary | ICD-10-CM | POA: Diagnosis not present

## 2015-02-02 DIAGNOSIS — E559 Vitamin D deficiency, unspecified: Secondary | ICD-10-CM | POA: Diagnosis not present

## 2015-02-02 DIAGNOSIS — Z9181 History of falling: Secondary | ICD-10-CM | POA: Diagnosis not present

## 2015-02-02 DIAGNOSIS — D649 Anemia, unspecified: Secondary | ICD-10-CM | POA: Diagnosis not present

## 2015-02-02 DIAGNOSIS — M19042 Primary osteoarthritis, left hand: Secondary | ICD-10-CM | POA: Diagnosis not present

## 2015-02-02 DIAGNOSIS — M19041 Primary osteoarthritis, right hand: Secondary | ICD-10-CM | POA: Diagnosis not present

## 2015-02-02 DIAGNOSIS — H9190 Unspecified hearing loss, unspecified ear: Secondary | ICD-10-CM | POA: Diagnosis not present

## 2015-02-02 DIAGNOSIS — I10 Essential (primary) hypertension: Secondary | ICD-10-CM | POA: Diagnosis not present

## 2015-02-03 DIAGNOSIS — H9190 Unspecified hearing loss, unspecified ear: Secondary | ICD-10-CM | POA: Diagnosis not present

## 2015-02-03 DIAGNOSIS — M19042 Primary osteoarthritis, left hand: Secondary | ICD-10-CM | POA: Diagnosis not present

## 2015-02-03 DIAGNOSIS — M19041 Primary osteoarthritis, right hand: Secondary | ICD-10-CM | POA: Diagnosis not present

## 2015-02-03 DIAGNOSIS — M1009 Idiopathic gout, multiple sites: Secondary | ICD-10-CM | POA: Diagnosis not present

## 2015-02-03 DIAGNOSIS — I69354 Hemiplegia and hemiparesis following cerebral infarction affecting left non-dominant side: Secondary | ICD-10-CM | POA: Diagnosis not present

## 2015-02-03 DIAGNOSIS — M170 Bilateral primary osteoarthritis of knee: Secondary | ICD-10-CM | POA: Diagnosis not present

## 2015-02-03 DIAGNOSIS — Z7901 Long term (current) use of anticoagulants: Secondary | ICD-10-CM | POA: Diagnosis not present

## 2015-02-03 DIAGNOSIS — D649 Anemia, unspecified: Secondary | ICD-10-CM | POA: Diagnosis not present

## 2015-02-03 DIAGNOSIS — I10 Essential (primary) hypertension: Secondary | ICD-10-CM | POA: Diagnosis not present

## 2015-02-03 DIAGNOSIS — E785 Hyperlipidemia, unspecified: Secondary | ICD-10-CM | POA: Diagnosis not present

## 2015-02-03 DIAGNOSIS — E559 Vitamin D deficiency, unspecified: Secondary | ICD-10-CM | POA: Diagnosis not present

## 2015-02-03 DIAGNOSIS — Z9181 History of falling: Secondary | ICD-10-CM | POA: Diagnosis not present

## 2015-02-03 DIAGNOSIS — R471 Dysarthria and anarthria: Secondary | ICD-10-CM | POA: Diagnosis not present

## 2015-02-06 DIAGNOSIS — D649 Anemia, unspecified: Secondary | ICD-10-CM | POA: Diagnosis not present

## 2015-02-06 DIAGNOSIS — Z9181 History of falling: Secondary | ICD-10-CM | POA: Diagnosis not present

## 2015-02-06 DIAGNOSIS — M19041 Primary osteoarthritis, right hand: Secondary | ICD-10-CM | POA: Diagnosis not present

## 2015-02-06 DIAGNOSIS — E785 Hyperlipidemia, unspecified: Secondary | ICD-10-CM | POA: Diagnosis not present

## 2015-02-06 DIAGNOSIS — H9190 Unspecified hearing loss, unspecified ear: Secondary | ICD-10-CM | POA: Diagnosis not present

## 2015-02-06 DIAGNOSIS — M170 Bilateral primary osteoarthritis of knee: Secondary | ICD-10-CM | POA: Diagnosis not present

## 2015-02-06 DIAGNOSIS — M19042 Primary osteoarthritis, left hand: Secondary | ICD-10-CM | POA: Diagnosis not present

## 2015-02-06 DIAGNOSIS — I69354 Hemiplegia and hemiparesis following cerebral infarction affecting left non-dominant side: Secondary | ICD-10-CM | POA: Diagnosis not present

## 2015-02-06 DIAGNOSIS — R471 Dysarthria and anarthria: Secondary | ICD-10-CM | POA: Diagnosis not present

## 2015-02-06 DIAGNOSIS — E559 Vitamin D deficiency, unspecified: Secondary | ICD-10-CM | POA: Diagnosis not present

## 2015-02-06 DIAGNOSIS — I10 Essential (primary) hypertension: Secondary | ICD-10-CM | POA: Diagnosis not present

## 2015-02-06 DIAGNOSIS — Z7901 Long term (current) use of anticoagulants: Secondary | ICD-10-CM | POA: Diagnosis not present

## 2015-02-06 DIAGNOSIS — M1009 Idiopathic gout, multiple sites: Secondary | ICD-10-CM | POA: Diagnosis not present

## 2015-02-07 DIAGNOSIS — M19042 Primary osteoarthritis, left hand: Secondary | ICD-10-CM | POA: Diagnosis not present

## 2015-02-07 DIAGNOSIS — D649 Anemia, unspecified: Secondary | ICD-10-CM | POA: Diagnosis not present

## 2015-02-07 DIAGNOSIS — M1009 Idiopathic gout, multiple sites: Secondary | ICD-10-CM | POA: Diagnosis not present

## 2015-02-07 DIAGNOSIS — Z9181 History of falling: Secondary | ICD-10-CM | POA: Diagnosis not present

## 2015-02-07 DIAGNOSIS — E559 Vitamin D deficiency, unspecified: Secondary | ICD-10-CM | POA: Diagnosis not present

## 2015-02-07 DIAGNOSIS — I69354 Hemiplegia and hemiparesis following cerebral infarction affecting left non-dominant side: Secondary | ICD-10-CM | POA: Diagnosis not present

## 2015-02-07 DIAGNOSIS — H9190 Unspecified hearing loss, unspecified ear: Secondary | ICD-10-CM | POA: Diagnosis not present

## 2015-02-07 DIAGNOSIS — M19041 Primary osteoarthritis, right hand: Secondary | ICD-10-CM | POA: Diagnosis not present

## 2015-02-07 DIAGNOSIS — Z7901 Long term (current) use of anticoagulants: Secondary | ICD-10-CM | POA: Diagnosis not present

## 2015-02-07 DIAGNOSIS — E785 Hyperlipidemia, unspecified: Secondary | ICD-10-CM | POA: Diagnosis not present

## 2015-02-07 DIAGNOSIS — R471 Dysarthria and anarthria: Secondary | ICD-10-CM | POA: Diagnosis not present

## 2015-02-07 DIAGNOSIS — I10 Essential (primary) hypertension: Secondary | ICD-10-CM | POA: Diagnosis not present

## 2015-02-07 DIAGNOSIS — M170 Bilateral primary osteoarthritis of knee: Secondary | ICD-10-CM | POA: Diagnosis not present

## 2015-02-08 DIAGNOSIS — R471 Dysarthria and anarthria: Secondary | ICD-10-CM | POA: Diagnosis not present

## 2015-02-08 DIAGNOSIS — E785 Hyperlipidemia, unspecified: Secondary | ICD-10-CM | POA: Diagnosis not present

## 2015-02-08 DIAGNOSIS — Z7901 Long term (current) use of anticoagulants: Secondary | ICD-10-CM | POA: Diagnosis not present

## 2015-02-08 DIAGNOSIS — D649 Anemia, unspecified: Secondary | ICD-10-CM | POA: Diagnosis not present

## 2015-02-08 DIAGNOSIS — H9190 Unspecified hearing loss, unspecified ear: Secondary | ICD-10-CM | POA: Diagnosis not present

## 2015-02-08 DIAGNOSIS — M1009 Idiopathic gout, multiple sites: Secondary | ICD-10-CM | POA: Diagnosis not present

## 2015-02-08 DIAGNOSIS — M170 Bilateral primary osteoarthritis of knee: Secondary | ICD-10-CM | POA: Diagnosis not present

## 2015-02-08 DIAGNOSIS — Z9181 History of falling: Secondary | ICD-10-CM | POA: Diagnosis not present

## 2015-02-08 DIAGNOSIS — E559 Vitamin D deficiency, unspecified: Secondary | ICD-10-CM | POA: Diagnosis not present

## 2015-02-08 DIAGNOSIS — M19041 Primary osteoarthritis, right hand: Secondary | ICD-10-CM | POA: Diagnosis not present

## 2015-02-08 DIAGNOSIS — I10 Essential (primary) hypertension: Secondary | ICD-10-CM | POA: Diagnosis not present

## 2015-02-08 DIAGNOSIS — I69354 Hemiplegia and hemiparesis following cerebral infarction affecting left non-dominant side: Secondary | ICD-10-CM | POA: Diagnosis not present

## 2015-02-08 DIAGNOSIS — M19042 Primary osteoarthritis, left hand: Secondary | ICD-10-CM | POA: Diagnosis not present

## 2015-02-09 DIAGNOSIS — H9190 Unspecified hearing loss, unspecified ear: Secondary | ICD-10-CM | POA: Diagnosis not present

## 2015-02-09 DIAGNOSIS — Z7901 Long term (current) use of anticoagulants: Secondary | ICD-10-CM | POA: Diagnosis not present

## 2015-02-09 DIAGNOSIS — M19041 Primary osteoarthritis, right hand: Secondary | ICD-10-CM | POA: Diagnosis not present

## 2015-02-09 DIAGNOSIS — E559 Vitamin D deficiency, unspecified: Secondary | ICD-10-CM | POA: Diagnosis not present

## 2015-02-09 DIAGNOSIS — D649 Anemia, unspecified: Secondary | ICD-10-CM | POA: Diagnosis not present

## 2015-02-09 DIAGNOSIS — M1009 Idiopathic gout, multiple sites: Secondary | ICD-10-CM | POA: Diagnosis not present

## 2015-02-09 DIAGNOSIS — M170 Bilateral primary osteoarthritis of knee: Secondary | ICD-10-CM | POA: Diagnosis not present

## 2015-02-09 DIAGNOSIS — R471 Dysarthria and anarthria: Secondary | ICD-10-CM | POA: Diagnosis not present

## 2015-02-09 DIAGNOSIS — I10 Essential (primary) hypertension: Secondary | ICD-10-CM | POA: Diagnosis not present

## 2015-02-09 DIAGNOSIS — I69354 Hemiplegia and hemiparesis following cerebral infarction affecting left non-dominant side: Secondary | ICD-10-CM | POA: Diagnosis not present

## 2015-02-09 DIAGNOSIS — Z9181 History of falling: Secondary | ICD-10-CM | POA: Diagnosis not present

## 2015-02-09 DIAGNOSIS — M19042 Primary osteoarthritis, left hand: Secondary | ICD-10-CM | POA: Diagnosis not present

## 2015-02-09 DIAGNOSIS — E785 Hyperlipidemia, unspecified: Secondary | ICD-10-CM | POA: Diagnosis not present

## 2015-02-10 DIAGNOSIS — M1009 Idiopathic gout, multiple sites: Secondary | ICD-10-CM | POA: Diagnosis not present

## 2015-02-10 DIAGNOSIS — H9190 Unspecified hearing loss, unspecified ear: Secondary | ICD-10-CM | POA: Diagnosis not present

## 2015-02-10 DIAGNOSIS — I69354 Hemiplegia and hemiparesis following cerebral infarction affecting left non-dominant side: Secondary | ICD-10-CM | POA: Diagnosis not present

## 2015-02-10 DIAGNOSIS — Z7901 Long term (current) use of anticoagulants: Secondary | ICD-10-CM | POA: Diagnosis not present

## 2015-02-10 DIAGNOSIS — I10 Essential (primary) hypertension: Secondary | ICD-10-CM | POA: Diagnosis not present

## 2015-02-10 DIAGNOSIS — M19041 Primary osteoarthritis, right hand: Secondary | ICD-10-CM | POA: Diagnosis not present

## 2015-02-10 DIAGNOSIS — E785 Hyperlipidemia, unspecified: Secondary | ICD-10-CM | POA: Diagnosis not present

## 2015-02-10 DIAGNOSIS — M170 Bilateral primary osteoarthritis of knee: Secondary | ICD-10-CM | POA: Diagnosis not present

## 2015-02-10 DIAGNOSIS — E559 Vitamin D deficiency, unspecified: Secondary | ICD-10-CM | POA: Diagnosis not present

## 2015-02-10 DIAGNOSIS — R471 Dysarthria and anarthria: Secondary | ICD-10-CM | POA: Diagnosis not present

## 2015-02-10 DIAGNOSIS — D649 Anemia, unspecified: Secondary | ICD-10-CM | POA: Diagnosis not present

## 2015-02-10 DIAGNOSIS — M19042 Primary osteoarthritis, left hand: Secondary | ICD-10-CM | POA: Diagnosis not present

## 2015-02-10 DIAGNOSIS — Z9181 History of falling: Secondary | ICD-10-CM | POA: Diagnosis not present

## 2015-02-13 DIAGNOSIS — Z9181 History of falling: Secondary | ICD-10-CM | POA: Diagnosis not present

## 2015-02-13 DIAGNOSIS — M19042 Primary osteoarthritis, left hand: Secondary | ICD-10-CM | POA: Diagnosis not present

## 2015-02-13 DIAGNOSIS — D649 Anemia, unspecified: Secondary | ICD-10-CM | POA: Diagnosis not present

## 2015-02-13 DIAGNOSIS — Z7901 Long term (current) use of anticoagulants: Secondary | ICD-10-CM | POA: Diagnosis not present

## 2015-02-13 DIAGNOSIS — I10 Essential (primary) hypertension: Secondary | ICD-10-CM | POA: Diagnosis not present

## 2015-02-13 DIAGNOSIS — M1009 Idiopathic gout, multiple sites: Secondary | ICD-10-CM | POA: Diagnosis not present

## 2015-02-13 DIAGNOSIS — M19041 Primary osteoarthritis, right hand: Secondary | ICD-10-CM | POA: Diagnosis not present

## 2015-02-13 DIAGNOSIS — I69354 Hemiplegia and hemiparesis following cerebral infarction affecting left non-dominant side: Secondary | ICD-10-CM | POA: Diagnosis not present

## 2015-02-13 DIAGNOSIS — E559 Vitamin D deficiency, unspecified: Secondary | ICD-10-CM | POA: Diagnosis not present

## 2015-02-13 DIAGNOSIS — H9190 Unspecified hearing loss, unspecified ear: Secondary | ICD-10-CM | POA: Diagnosis not present

## 2015-02-13 DIAGNOSIS — E785 Hyperlipidemia, unspecified: Secondary | ICD-10-CM | POA: Diagnosis not present

## 2015-02-13 DIAGNOSIS — R471 Dysarthria and anarthria: Secondary | ICD-10-CM | POA: Diagnosis not present

## 2015-02-13 DIAGNOSIS — M170 Bilateral primary osteoarthritis of knee: Secondary | ICD-10-CM | POA: Diagnosis not present

## 2015-02-14 DIAGNOSIS — I69354 Hemiplegia and hemiparesis following cerebral infarction affecting left non-dominant side: Secondary | ICD-10-CM | POA: Diagnosis not present

## 2015-02-14 DIAGNOSIS — E559 Vitamin D deficiency, unspecified: Secondary | ICD-10-CM | POA: Diagnosis not present

## 2015-02-14 DIAGNOSIS — D649 Anemia, unspecified: Secondary | ICD-10-CM | POA: Diagnosis not present

## 2015-02-14 DIAGNOSIS — R471 Dysarthria and anarthria: Secondary | ICD-10-CM | POA: Diagnosis not present

## 2015-02-14 DIAGNOSIS — M1009 Idiopathic gout, multiple sites: Secondary | ICD-10-CM | POA: Diagnosis not present

## 2015-02-14 DIAGNOSIS — E785 Hyperlipidemia, unspecified: Secondary | ICD-10-CM | POA: Diagnosis not present

## 2015-02-14 DIAGNOSIS — Z9181 History of falling: Secondary | ICD-10-CM | POA: Diagnosis not present

## 2015-02-14 DIAGNOSIS — M19042 Primary osteoarthritis, left hand: Secondary | ICD-10-CM | POA: Diagnosis not present

## 2015-02-14 DIAGNOSIS — Z7901 Long term (current) use of anticoagulants: Secondary | ICD-10-CM | POA: Diagnosis not present

## 2015-02-14 DIAGNOSIS — H9190 Unspecified hearing loss, unspecified ear: Secondary | ICD-10-CM | POA: Diagnosis not present

## 2015-02-14 DIAGNOSIS — M19041 Primary osteoarthritis, right hand: Secondary | ICD-10-CM | POA: Diagnosis not present

## 2015-02-14 DIAGNOSIS — I10 Essential (primary) hypertension: Secondary | ICD-10-CM | POA: Diagnosis not present

## 2015-02-14 DIAGNOSIS — M170 Bilateral primary osteoarthritis of knee: Secondary | ICD-10-CM | POA: Diagnosis not present

## 2015-02-15 DIAGNOSIS — M19042 Primary osteoarthritis, left hand: Secondary | ICD-10-CM | POA: Diagnosis not present

## 2015-02-15 DIAGNOSIS — H9190 Unspecified hearing loss, unspecified ear: Secondary | ICD-10-CM | POA: Diagnosis not present

## 2015-02-15 DIAGNOSIS — Z9181 History of falling: Secondary | ICD-10-CM | POA: Diagnosis not present

## 2015-02-15 DIAGNOSIS — D649 Anemia, unspecified: Secondary | ICD-10-CM | POA: Diagnosis not present

## 2015-02-15 DIAGNOSIS — I10 Essential (primary) hypertension: Secondary | ICD-10-CM | POA: Diagnosis not present

## 2015-02-15 DIAGNOSIS — Z7901 Long term (current) use of anticoagulants: Secondary | ICD-10-CM | POA: Diagnosis not present

## 2015-02-15 DIAGNOSIS — M19041 Primary osteoarthritis, right hand: Secondary | ICD-10-CM | POA: Diagnosis not present

## 2015-02-15 DIAGNOSIS — M1009 Idiopathic gout, multiple sites: Secondary | ICD-10-CM | POA: Diagnosis not present

## 2015-02-15 DIAGNOSIS — R471 Dysarthria and anarthria: Secondary | ICD-10-CM | POA: Diagnosis not present

## 2015-02-15 DIAGNOSIS — I69354 Hemiplegia and hemiparesis following cerebral infarction affecting left non-dominant side: Secondary | ICD-10-CM | POA: Diagnosis not present

## 2015-02-15 DIAGNOSIS — M170 Bilateral primary osteoarthritis of knee: Secondary | ICD-10-CM | POA: Diagnosis not present

## 2015-02-15 DIAGNOSIS — E559 Vitamin D deficiency, unspecified: Secondary | ICD-10-CM | POA: Diagnosis not present

## 2015-02-15 DIAGNOSIS — E785 Hyperlipidemia, unspecified: Secondary | ICD-10-CM | POA: Diagnosis not present

## 2015-02-16 DIAGNOSIS — H9190 Unspecified hearing loss, unspecified ear: Secondary | ICD-10-CM | POA: Diagnosis not present

## 2015-02-16 DIAGNOSIS — M170 Bilateral primary osteoarthritis of knee: Secondary | ICD-10-CM | POA: Diagnosis not present

## 2015-02-16 DIAGNOSIS — Z9181 History of falling: Secondary | ICD-10-CM | POA: Diagnosis not present

## 2015-02-16 DIAGNOSIS — E785 Hyperlipidemia, unspecified: Secondary | ICD-10-CM | POA: Diagnosis not present

## 2015-02-16 DIAGNOSIS — M1009 Idiopathic gout, multiple sites: Secondary | ICD-10-CM | POA: Diagnosis not present

## 2015-02-16 DIAGNOSIS — R471 Dysarthria and anarthria: Secondary | ICD-10-CM | POA: Diagnosis not present

## 2015-02-16 DIAGNOSIS — E559 Vitamin D deficiency, unspecified: Secondary | ICD-10-CM | POA: Diagnosis not present

## 2015-02-16 DIAGNOSIS — Z7901 Long term (current) use of anticoagulants: Secondary | ICD-10-CM | POA: Diagnosis not present

## 2015-02-16 DIAGNOSIS — M19041 Primary osteoarthritis, right hand: Secondary | ICD-10-CM | POA: Diagnosis not present

## 2015-02-16 DIAGNOSIS — I69354 Hemiplegia and hemiparesis following cerebral infarction affecting left non-dominant side: Secondary | ICD-10-CM | POA: Diagnosis not present

## 2015-02-16 DIAGNOSIS — M19042 Primary osteoarthritis, left hand: Secondary | ICD-10-CM | POA: Diagnosis not present

## 2015-02-16 DIAGNOSIS — I10 Essential (primary) hypertension: Secondary | ICD-10-CM | POA: Diagnosis not present

## 2015-02-16 DIAGNOSIS — D649 Anemia, unspecified: Secondary | ICD-10-CM | POA: Diagnosis not present

## 2015-02-20 DIAGNOSIS — M1009 Idiopathic gout, multiple sites: Secondary | ICD-10-CM | POA: Diagnosis not present

## 2015-02-20 DIAGNOSIS — D649 Anemia, unspecified: Secondary | ICD-10-CM | POA: Diagnosis not present

## 2015-02-20 DIAGNOSIS — I69354 Hemiplegia and hemiparesis following cerebral infarction affecting left non-dominant side: Secondary | ICD-10-CM | POA: Diagnosis not present

## 2015-02-20 DIAGNOSIS — Z7901 Long term (current) use of anticoagulants: Secondary | ICD-10-CM | POA: Diagnosis not present

## 2015-02-20 DIAGNOSIS — H9190 Unspecified hearing loss, unspecified ear: Secondary | ICD-10-CM | POA: Diagnosis not present

## 2015-02-20 DIAGNOSIS — E785 Hyperlipidemia, unspecified: Secondary | ICD-10-CM | POA: Diagnosis not present

## 2015-02-20 DIAGNOSIS — M170 Bilateral primary osteoarthritis of knee: Secondary | ICD-10-CM | POA: Diagnosis not present

## 2015-02-20 DIAGNOSIS — I10 Essential (primary) hypertension: Secondary | ICD-10-CM | POA: Diagnosis not present

## 2015-02-20 DIAGNOSIS — Z9181 History of falling: Secondary | ICD-10-CM | POA: Diagnosis not present

## 2015-02-20 DIAGNOSIS — M19041 Primary osteoarthritis, right hand: Secondary | ICD-10-CM | POA: Diagnosis not present

## 2015-02-20 DIAGNOSIS — M19042 Primary osteoarthritis, left hand: Secondary | ICD-10-CM | POA: Diagnosis not present

## 2015-02-20 DIAGNOSIS — R471 Dysarthria and anarthria: Secondary | ICD-10-CM | POA: Diagnosis not present

## 2015-02-20 DIAGNOSIS — E559 Vitamin D deficiency, unspecified: Secondary | ICD-10-CM | POA: Diagnosis not present

## 2015-02-21 DIAGNOSIS — Z7901 Long term (current) use of anticoagulants: Secondary | ICD-10-CM | POA: Diagnosis not present

## 2015-02-21 DIAGNOSIS — I10 Essential (primary) hypertension: Secondary | ICD-10-CM | POA: Diagnosis not present

## 2015-02-21 DIAGNOSIS — E559 Vitamin D deficiency, unspecified: Secondary | ICD-10-CM | POA: Diagnosis not present

## 2015-02-21 DIAGNOSIS — Z9181 History of falling: Secondary | ICD-10-CM | POA: Diagnosis not present

## 2015-02-21 DIAGNOSIS — H9190 Unspecified hearing loss, unspecified ear: Secondary | ICD-10-CM | POA: Diagnosis not present

## 2015-02-21 DIAGNOSIS — E785 Hyperlipidemia, unspecified: Secondary | ICD-10-CM | POA: Diagnosis not present

## 2015-02-21 DIAGNOSIS — M19041 Primary osteoarthritis, right hand: Secondary | ICD-10-CM | POA: Diagnosis not present

## 2015-02-21 DIAGNOSIS — M1009 Idiopathic gout, multiple sites: Secondary | ICD-10-CM | POA: Diagnosis not present

## 2015-02-21 DIAGNOSIS — I69354 Hemiplegia and hemiparesis following cerebral infarction affecting left non-dominant side: Secondary | ICD-10-CM | POA: Diagnosis not present

## 2015-02-21 DIAGNOSIS — R471 Dysarthria and anarthria: Secondary | ICD-10-CM | POA: Diagnosis not present

## 2015-02-21 DIAGNOSIS — M19042 Primary osteoarthritis, left hand: Secondary | ICD-10-CM | POA: Diagnosis not present

## 2015-02-21 DIAGNOSIS — M170 Bilateral primary osteoarthritis of knee: Secondary | ICD-10-CM | POA: Diagnosis not present

## 2015-02-21 DIAGNOSIS — D649 Anemia, unspecified: Secondary | ICD-10-CM | POA: Diagnosis not present

## 2015-02-23 DIAGNOSIS — M19042 Primary osteoarthritis, left hand: Secondary | ICD-10-CM | POA: Diagnosis not present

## 2015-02-23 DIAGNOSIS — Z9181 History of falling: Secondary | ICD-10-CM | POA: Diagnosis not present

## 2015-02-23 DIAGNOSIS — Z7901 Long term (current) use of anticoagulants: Secondary | ICD-10-CM | POA: Diagnosis not present

## 2015-02-23 DIAGNOSIS — H9190 Unspecified hearing loss, unspecified ear: Secondary | ICD-10-CM | POA: Diagnosis not present

## 2015-02-23 DIAGNOSIS — E785 Hyperlipidemia, unspecified: Secondary | ICD-10-CM | POA: Diagnosis not present

## 2015-02-23 DIAGNOSIS — M1009 Idiopathic gout, multiple sites: Secondary | ICD-10-CM | POA: Diagnosis not present

## 2015-02-23 DIAGNOSIS — M19041 Primary osteoarthritis, right hand: Secondary | ICD-10-CM | POA: Diagnosis not present

## 2015-02-23 DIAGNOSIS — M170 Bilateral primary osteoarthritis of knee: Secondary | ICD-10-CM | POA: Diagnosis not present

## 2015-02-23 DIAGNOSIS — I10 Essential (primary) hypertension: Secondary | ICD-10-CM | POA: Diagnosis not present

## 2015-02-23 DIAGNOSIS — E559 Vitamin D deficiency, unspecified: Secondary | ICD-10-CM | POA: Diagnosis not present

## 2015-02-23 DIAGNOSIS — I69354 Hemiplegia and hemiparesis following cerebral infarction affecting left non-dominant side: Secondary | ICD-10-CM | POA: Diagnosis not present

## 2015-02-23 DIAGNOSIS — D649 Anemia, unspecified: Secondary | ICD-10-CM | POA: Diagnosis not present

## 2015-02-23 DIAGNOSIS — R471 Dysarthria and anarthria: Secondary | ICD-10-CM | POA: Diagnosis not present

## 2015-02-27 DIAGNOSIS — E559 Vitamin D deficiency, unspecified: Secondary | ICD-10-CM | POA: Diagnosis not present

## 2015-02-27 DIAGNOSIS — I69354 Hemiplegia and hemiparesis following cerebral infarction affecting left non-dominant side: Secondary | ICD-10-CM | POA: Diagnosis not present

## 2015-02-27 DIAGNOSIS — M1009 Idiopathic gout, multiple sites: Secondary | ICD-10-CM | POA: Diagnosis not present

## 2015-02-27 DIAGNOSIS — M170 Bilateral primary osteoarthritis of knee: Secondary | ICD-10-CM | POA: Diagnosis not present

## 2015-02-27 DIAGNOSIS — M19042 Primary osteoarthritis, left hand: Secondary | ICD-10-CM | POA: Diagnosis not present

## 2015-02-27 DIAGNOSIS — Z7901 Long term (current) use of anticoagulants: Secondary | ICD-10-CM | POA: Diagnosis not present

## 2015-02-27 DIAGNOSIS — M19041 Primary osteoarthritis, right hand: Secondary | ICD-10-CM | POA: Diagnosis not present

## 2015-02-27 DIAGNOSIS — D649 Anemia, unspecified: Secondary | ICD-10-CM | POA: Diagnosis not present

## 2015-02-27 DIAGNOSIS — Z9181 History of falling: Secondary | ICD-10-CM | POA: Diagnosis not present

## 2015-02-27 DIAGNOSIS — E785 Hyperlipidemia, unspecified: Secondary | ICD-10-CM | POA: Diagnosis not present

## 2015-02-27 DIAGNOSIS — H9190 Unspecified hearing loss, unspecified ear: Secondary | ICD-10-CM | POA: Diagnosis not present

## 2015-02-27 DIAGNOSIS — I10 Essential (primary) hypertension: Secondary | ICD-10-CM | POA: Diagnosis not present

## 2015-02-27 DIAGNOSIS — R471 Dysarthria and anarthria: Secondary | ICD-10-CM | POA: Diagnosis not present

## 2015-03-02 DIAGNOSIS — M19041 Primary osteoarthritis, right hand: Secondary | ICD-10-CM | POA: Diagnosis not present

## 2015-03-02 DIAGNOSIS — I10 Essential (primary) hypertension: Secondary | ICD-10-CM | POA: Diagnosis not present

## 2015-03-02 DIAGNOSIS — M1009 Idiopathic gout, multiple sites: Secondary | ICD-10-CM | POA: Diagnosis not present

## 2015-03-02 DIAGNOSIS — H9190 Unspecified hearing loss, unspecified ear: Secondary | ICD-10-CM | POA: Diagnosis not present

## 2015-03-02 DIAGNOSIS — D649 Anemia, unspecified: Secondary | ICD-10-CM | POA: Diagnosis not present

## 2015-03-02 DIAGNOSIS — I69354 Hemiplegia and hemiparesis following cerebral infarction affecting left non-dominant side: Secondary | ICD-10-CM | POA: Diagnosis not present

## 2015-03-02 DIAGNOSIS — Z7901 Long term (current) use of anticoagulants: Secondary | ICD-10-CM | POA: Diagnosis not present

## 2015-03-02 DIAGNOSIS — Z9181 History of falling: Secondary | ICD-10-CM | POA: Diagnosis not present

## 2015-03-02 DIAGNOSIS — E559 Vitamin D deficiency, unspecified: Secondary | ICD-10-CM | POA: Diagnosis not present

## 2015-03-02 DIAGNOSIS — M170 Bilateral primary osteoarthritis of knee: Secondary | ICD-10-CM | POA: Diagnosis not present

## 2015-03-02 DIAGNOSIS — M19042 Primary osteoarthritis, left hand: Secondary | ICD-10-CM | POA: Diagnosis not present

## 2015-03-02 DIAGNOSIS — E785 Hyperlipidemia, unspecified: Secondary | ICD-10-CM | POA: Diagnosis not present

## 2015-03-02 DIAGNOSIS — R471 Dysarthria and anarthria: Secondary | ICD-10-CM | POA: Diagnosis not present

## 2015-03-07 DIAGNOSIS — Z9181 History of falling: Secondary | ICD-10-CM | POA: Diagnosis not present

## 2015-03-07 DIAGNOSIS — I69354 Hemiplegia and hemiparesis following cerebral infarction affecting left non-dominant side: Secondary | ICD-10-CM | POA: Diagnosis not present

## 2015-03-07 DIAGNOSIS — E559 Vitamin D deficiency, unspecified: Secondary | ICD-10-CM | POA: Diagnosis not present

## 2015-03-07 DIAGNOSIS — D649 Anemia, unspecified: Secondary | ICD-10-CM | POA: Diagnosis not present

## 2015-03-07 DIAGNOSIS — E785 Hyperlipidemia, unspecified: Secondary | ICD-10-CM | POA: Diagnosis not present

## 2015-03-07 DIAGNOSIS — M19042 Primary osteoarthritis, left hand: Secondary | ICD-10-CM | POA: Diagnosis not present

## 2015-03-07 DIAGNOSIS — R471 Dysarthria and anarthria: Secondary | ICD-10-CM | POA: Diagnosis not present

## 2015-03-07 DIAGNOSIS — M170 Bilateral primary osteoarthritis of knee: Secondary | ICD-10-CM | POA: Diagnosis not present

## 2015-03-07 DIAGNOSIS — I10 Essential (primary) hypertension: Secondary | ICD-10-CM | POA: Diagnosis not present

## 2015-03-07 DIAGNOSIS — Z7901 Long term (current) use of anticoagulants: Secondary | ICD-10-CM | POA: Diagnosis not present

## 2015-03-07 DIAGNOSIS — M19041 Primary osteoarthritis, right hand: Secondary | ICD-10-CM | POA: Diagnosis not present

## 2015-03-07 DIAGNOSIS — H9190 Unspecified hearing loss, unspecified ear: Secondary | ICD-10-CM | POA: Diagnosis not present

## 2015-03-07 DIAGNOSIS — M1009 Idiopathic gout, multiple sites: Secondary | ICD-10-CM | POA: Diagnosis not present

## 2015-03-09 DIAGNOSIS — M19042 Primary osteoarthritis, left hand: Secondary | ICD-10-CM | POA: Diagnosis not present

## 2015-03-09 DIAGNOSIS — Z9181 History of falling: Secondary | ICD-10-CM | POA: Diagnosis not present

## 2015-03-09 DIAGNOSIS — M1009 Idiopathic gout, multiple sites: Secondary | ICD-10-CM | POA: Diagnosis not present

## 2015-03-09 DIAGNOSIS — M19041 Primary osteoarthritis, right hand: Secondary | ICD-10-CM | POA: Diagnosis not present

## 2015-03-09 DIAGNOSIS — E559 Vitamin D deficiency, unspecified: Secondary | ICD-10-CM | POA: Diagnosis not present

## 2015-03-09 DIAGNOSIS — I69354 Hemiplegia and hemiparesis following cerebral infarction affecting left non-dominant side: Secondary | ICD-10-CM | POA: Diagnosis not present

## 2015-03-09 DIAGNOSIS — D649 Anemia, unspecified: Secondary | ICD-10-CM | POA: Diagnosis not present

## 2015-03-09 DIAGNOSIS — H9190 Unspecified hearing loss, unspecified ear: Secondary | ICD-10-CM | POA: Diagnosis not present

## 2015-03-09 DIAGNOSIS — Z7901 Long term (current) use of anticoagulants: Secondary | ICD-10-CM | POA: Diagnosis not present

## 2015-03-09 DIAGNOSIS — I10 Essential (primary) hypertension: Secondary | ICD-10-CM | POA: Diagnosis not present

## 2015-03-09 DIAGNOSIS — M170 Bilateral primary osteoarthritis of knee: Secondary | ICD-10-CM | POA: Diagnosis not present

## 2015-03-09 DIAGNOSIS — E785 Hyperlipidemia, unspecified: Secondary | ICD-10-CM | POA: Diagnosis not present

## 2015-03-09 DIAGNOSIS — R471 Dysarthria and anarthria: Secondary | ICD-10-CM | POA: Diagnosis not present

## 2015-03-14 DIAGNOSIS — M170 Bilateral primary osteoarthritis of knee: Secondary | ICD-10-CM | POA: Diagnosis not present

## 2015-03-14 DIAGNOSIS — E785 Hyperlipidemia, unspecified: Secondary | ICD-10-CM | POA: Diagnosis not present

## 2015-03-14 DIAGNOSIS — Z7901 Long term (current) use of anticoagulants: Secondary | ICD-10-CM | POA: Diagnosis not present

## 2015-03-14 DIAGNOSIS — M1009 Idiopathic gout, multiple sites: Secondary | ICD-10-CM | POA: Diagnosis not present

## 2015-03-14 DIAGNOSIS — E559 Vitamin D deficiency, unspecified: Secondary | ICD-10-CM | POA: Diagnosis not present

## 2015-03-14 DIAGNOSIS — Z9181 History of falling: Secondary | ICD-10-CM | POA: Diagnosis not present

## 2015-03-14 DIAGNOSIS — M19041 Primary osteoarthritis, right hand: Secondary | ICD-10-CM | POA: Diagnosis not present

## 2015-03-14 DIAGNOSIS — H9190 Unspecified hearing loss, unspecified ear: Secondary | ICD-10-CM | POA: Diagnosis not present

## 2015-03-14 DIAGNOSIS — I69354 Hemiplegia and hemiparesis following cerebral infarction affecting left non-dominant side: Secondary | ICD-10-CM | POA: Diagnosis not present

## 2015-03-14 DIAGNOSIS — R471 Dysarthria and anarthria: Secondary | ICD-10-CM | POA: Diagnosis not present

## 2015-03-14 DIAGNOSIS — D649 Anemia, unspecified: Secondary | ICD-10-CM | POA: Diagnosis not present

## 2015-03-14 DIAGNOSIS — I10 Essential (primary) hypertension: Secondary | ICD-10-CM | POA: Diagnosis not present

## 2015-03-14 DIAGNOSIS — M19042 Primary osteoarthritis, left hand: Secondary | ICD-10-CM | POA: Diagnosis not present

## 2015-03-16 DIAGNOSIS — R471 Dysarthria and anarthria: Secondary | ICD-10-CM | POA: Diagnosis not present

## 2015-03-16 DIAGNOSIS — I69354 Hemiplegia and hemiparesis following cerebral infarction affecting left non-dominant side: Secondary | ICD-10-CM | POA: Diagnosis not present

## 2015-03-16 DIAGNOSIS — D649 Anemia, unspecified: Secondary | ICD-10-CM | POA: Diagnosis not present

## 2015-03-16 DIAGNOSIS — I10 Essential (primary) hypertension: Secondary | ICD-10-CM | POA: Diagnosis not present

## 2015-03-16 DIAGNOSIS — E785 Hyperlipidemia, unspecified: Secondary | ICD-10-CM | POA: Diagnosis not present

## 2015-03-16 DIAGNOSIS — M1009 Idiopathic gout, multiple sites: Secondary | ICD-10-CM | POA: Diagnosis not present

## 2015-03-16 DIAGNOSIS — H9190 Unspecified hearing loss, unspecified ear: Secondary | ICD-10-CM | POA: Diagnosis not present

## 2015-03-16 DIAGNOSIS — M19041 Primary osteoarthritis, right hand: Secondary | ICD-10-CM | POA: Diagnosis not present

## 2015-03-16 DIAGNOSIS — Z7901 Long term (current) use of anticoagulants: Secondary | ICD-10-CM | POA: Diagnosis not present

## 2015-03-16 DIAGNOSIS — E559 Vitamin D deficiency, unspecified: Secondary | ICD-10-CM | POA: Diagnosis not present

## 2015-03-16 DIAGNOSIS — M170 Bilateral primary osteoarthritis of knee: Secondary | ICD-10-CM | POA: Diagnosis not present

## 2015-03-16 DIAGNOSIS — M19042 Primary osteoarthritis, left hand: Secondary | ICD-10-CM | POA: Diagnosis not present

## 2015-03-16 DIAGNOSIS — Z9181 History of falling: Secondary | ICD-10-CM | POA: Diagnosis not present

## 2016-01-08 DIAGNOSIS — H02032 Senile entropion of right lower eyelid: Secondary | ICD-10-CM | POA: Diagnosis not present

## 2016-01-25 DIAGNOSIS — H02032 Senile entropion of right lower eyelid: Secondary | ICD-10-CM | POA: Diagnosis not present

## 2016-02-07 DIAGNOSIS — I10 Essential (primary) hypertension: Secondary | ICD-10-CM | POA: Diagnosis not present

## 2016-02-07 DIAGNOSIS — R2681 Unsteadiness on feet: Secondary | ICD-10-CM | POA: Diagnosis not present

## 2016-02-07 DIAGNOSIS — R7309 Other abnormal glucose: Secondary | ICD-10-CM | POA: Diagnosis not present

## 2016-02-07 DIAGNOSIS — E785 Hyperlipidemia, unspecified: Secondary | ICD-10-CM | POA: Diagnosis not present

## 2016-04-24 DIAGNOSIS — Z961 Presence of intraocular lens: Secondary | ICD-10-CM | POA: Diagnosis not present

## 2016-04-24 DIAGNOSIS — H10021 Other mucopurulent conjunctivitis, right eye: Secondary | ICD-10-CM | POA: Diagnosis not present

## 2016-08-15 DIAGNOSIS — R471 Dysarthria and anarthria: Secondary | ICD-10-CM | POA: Diagnosis not present

## 2016-08-15 DIAGNOSIS — E559 Vitamin D deficiency, unspecified: Secondary | ICD-10-CM | POA: Diagnosis not present

## 2016-08-15 DIAGNOSIS — R7309 Other abnormal glucose: Secondary | ICD-10-CM | POA: Diagnosis not present

## 2016-08-15 DIAGNOSIS — E785 Hyperlipidemia, unspecified: Secondary | ICD-10-CM | POA: Diagnosis not present

## 2016-08-15 DIAGNOSIS — I10 Essential (primary) hypertension: Secondary | ICD-10-CM | POA: Diagnosis not present

## 2017-01-27 ENCOUNTER — Emergency Department (HOSPITAL_COMMUNITY): Payer: Medicare Other

## 2017-01-27 ENCOUNTER — Observation Stay (HOSPITAL_COMMUNITY)
Admission: EM | Admit: 2017-01-27 | Discharge: 2017-01-28 | Disposition: A | Discharge status: Skilled Nursing Facility | Payer: Medicare Other | Attending: Family Medicine | Admitting: Family Medicine

## 2017-01-27 ENCOUNTER — Observation Stay (HOSPITAL_COMMUNITY): Payer: Medicare Other

## 2017-01-27 ENCOUNTER — Encounter (HOSPITAL_COMMUNITY): Payer: Self-pay | Admitting: *Deleted

## 2017-01-27 DIAGNOSIS — M5136 Other intervertebral disc degeneration, lumbar region: Secondary | ICD-10-CM | POA: Diagnosis not present

## 2017-01-27 DIAGNOSIS — Z66 Do not resuscitate: Secondary | ICD-10-CM | POA: Insufficient documentation

## 2017-01-27 DIAGNOSIS — M79604 Pain in right leg: Secondary | ICD-10-CM | POA: Diagnosis present

## 2017-01-27 DIAGNOSIS — Z880 Allergy status to penicillin: Secondary | ICD-10-CM | POA: Insufficient documentation

## 2017-01-27 DIAGNOSIS — R29898 Other symptoms and signs involving the musculoskeletal system: Secondary | ICD-10-CM | POA: Diagnosis present

## 2017-01-27 DIAGNOSIS — I480 Paroxysmal atrial fibrillation: Secondary | ICD-10-CM | POA: Diagnosis present

## 2017-01-27 DIAGNOSIS — R531 Weakness: Secondary | ICD-10-CM

## 2017-01-27 DIAGNOSIS — I493 Ventricular premature depolarization: Secondary | ICD-10-CM | POA: Insufficient documentation

## 2017-01-27 DIAGNOSIS — R2681 Unsteadiness on feet: Secondary | ICD-10-CM | POA: Diagnosis present

## 2017-01-27 DIAGNOSIS — I509 Heart failure, unspecified: Secondary | ICD-10-CM | POA: Insufficient documentation

## 2017-01-27 DIAGNOSIS — Z7401 Bed confinement status: Secondary | ICD-10-CM | POA: Diagnosis not present

## 2017-01-27 DIAGNOSIS — Z79899 Other long term (current) drug therapy: Secondary | ICD-10-CM | POA: Insufficient documentation

## 2017-01-27 DIAGNOSIS — M25551 Pain in right hip: Secondary | ICD-10-CM

## 2017-01-27 DIAGNOSIS — W182XXA Fall in (into) shower or empty bathtub, initial encounter: Secondary | ICD-10-CM | POA: Diagnosis not present

## 2017-01-27 DIAGNOSIS — E785 Hyperlipidemia, unspecified: Secondary | ICD-10-CM | POA: Insufficient documentation

## 2017-01-27 DIAGNOSIS — S8991XA Unspecified injury of right lower leg, initial encounter: Secondary | ICD-10-CM | POA: Diagnosis not present

## 2017-01-27 DIAGNOSIS — I482 Chronic atrial fibrillation: Secondary | ICD-10-CM | POA: Diagnosis not present

## 2017-01-27 DIAGNOSIS — I110 Hypertensive heart disease with heart failure: Secondary | ICD-10-CM | POA: Diagnosis not present

## 2017-01-27 DIAGNOSIS — R2689 Other abnormalities of gait and mobility: Secondary | ICD-10-CM

## 2017-01-27 DIAGNOSIS — Z853 Personal history of malignant neoplasm of breast: Secondary | ICD-10-CM | POA: Insufficient documentation

## 2017-01-27 DIAGNOSIS — Z8673 Personal history of transient ischemic attack (TIA), and cerebral infarction without residual deficits: Secondary | ICD-10-CM | POA: Insufficient documentation

## 2017-01-27 DIAGNOSIS — Z7901 Long term (current) use of anticoagulants: Secondary | ICD-10-CM | POA: Diagnosis not present

## 2017-01-27 DIAGNOSIS — M1611 Unilateral primary osteoarthritis, right hip: Secondary | ICD-10-CM | POA: Diagnosis not present

## 2017-01-27 DIAGNOSIS — M4316 Spondylolisthesis, lumbar region: Secondary | ICD-10-CM | POA: Insufficient documentation

## 2017-01-27 DIAGNOSIS — Y92002 Bathroom of unspecified non-institutional (private) residence single-family (private) house as the place of occurrence of the external cause: Secondary | ICD-10-CM | POA: Insufficient documentation

## 2017-01-27 DIAGNOSIS — Y93E1 Activity, personal bathing and showering: Secondary | ICD-10-CM | POA: Insufficient documentation

## 2017-01-27 DIAGNOSIS — M79661 Pain in right lower leg: Secondary | ICD-10-CM | POA: Diagnosis not present

## 2017-01-27 DIAGNOSIS — I10 Essential (primary) hypertension: Secondary | ICD-10-CM | POA: Diagnosis not present

## 2017-01-27 HISTORY — DX: Atherosclerotic heart disease of native coronary artery without angina pectoris: I25.10

## 2017-01-27 HISTORY — DX: Unspecified osteoarthritis, unspecified site: M19.90

## 2017-01-27 HISTORY — DX: Malignant neoplasm of unspecified site of right female breast: C50.911

## 2017-01-27 HISTORY — DX: Angina pectoris, unspecified: I20.9

## 2017-01-27 HISTORY — DX: Cerebral infarction, unspecified: I63.9

## 2017-01-27 HISTORY — DX: Gout, unspecified: M10.9

## 2017-01-27 HISTORY — DX: Migraine, unspecified, not intractable, without status migrainosus: G43.909

## 2017-01-27 LAB — I-STAT TROPONIN, ED: Troponin i, poc: 0.03 ng/mL (ref 0.00–0.08)

## 2017-01-27 LAB — APTT: aPTT: 36 seconds (ref 24–36)

## 2017-01-27 LAB — CBC
HCT: 41.3 % (ref 36.0–46.0)
Hemoglobin: 13.7 g/dL (ref 12.0–15.0)
MCH: 27 pg (ref 26.0–34.0)
MCHC: 33.2 g/dL (ref 30.0–36.0)
MCV: 81.5 fL (ref 78.0–100.0)
PLATELETS: 178 10*3/uL (ref 150–400)
RBC: 5.07 MIL/uL (ref 3.87–5.11)
RDW: 13.9 % (ref 11.5–15.5)
WBC: 6.3 10*3/uL (ref 4.0–10.5)

## 2017-01-27 LAB — DIFFERENTIAL
BASOS PCT: 0 %
Basophils Absolute: 0 10*3/uL (ref 0.0–0.1)
EOS PCT: 5 %
Eosinophils Absolute: 0.3 10*3/uL (ref 0.0–0.7)
Lymphocytes Relative: 40 %
Lymphs Abs: 2.5 10*3/uL (ref 0.7–4.0)
MONO ABS: 0.6 10*3/uL (ref 0.1–1.0)
Monocytes Relative: 10 %
NEUTROS PCT: 45 %
Neutro Abs: 2.9 10*3/uL (ref 1.7–7.7)

## 2017-01-27 LAB — I-STAT CHEM 8, ED
BUN: 17 mg/dL (ref 6–20)
CHLORIDE: 103 mmol/L (ref 101–111)
Calcium, Ion: 1.11 mmol/L — ABNORMAL LOW (ref 1.15–1.40)
Creatinine, Ser: 0.8 mg/dL (ref 0.44–1.00)
Glucose, Bld: 103 mg/dL — ABNORMAL HIGH (ref 65–99)
HCT: 44 % (ref 36.0–46.0)
Hemoglobin: 15 g/dL (ref 12.0–15.0)
POTASSIUM: 4.1 mmol/L (ref 3.5–5.1)
SODIUM: 141 mmol/L (ref 135–145)
TCO2: 24 mmol/L (ref 0–100)

## 2017-01-27 LAB — COMPREHENSIVE METABOLIC PANEL
ALBUMIN: 3.4 g/dL — AB (ref 3.5–5.0)
ALT: 41 U/L (ref 14–54)
ANION GAP: 10 (ref 5–15)
AST: 43 U/L — ABNORMAL HIGH (ref 15–41)
Alkaline Phosphatase: 74 U/L (ref 38–126)
BUN: 15 mg/dL (ref 6–20)
CHLORIDE: 104 mmol/L (ref 101–111)
CO2: 26 mmol/L (ref 22–32)
Calcium: 9.5 mg/dL (ref 8.9–10.3)
Creatinine, Ser: 0.89 mg/dL (ref 0.44–1.00)
GFR calc Af Amer: >60 mL/min (ref >60)
GFR calc non Af Amer: 56 mL/min — ABNORMAL LOW (ref >60)
Glucose, Bld: 104 mg/dL — ABNORMAL HIGH (ref 65–99)
Potassium: 4.1 mmol/L (ref 3.5–5.1)
Sodium: 140 mmol/L (ref 135–145)
Total Bilirubin: 0.8 mg/dL (ref 0.3–1.2)
Total Protein: 7.3 g/dL (ref 6.5–8.1)

## 2017-01-27 LAB — CK: Total CK: 822 U/L — ABNORMAL HIGH (ref 38–234)

## 2017-01-27 LAB — PROTIME-INR
INR: 1.22
Prothrombin Time: 15.5 seconds — ABNORMAL HIGH (ref 11.4–15.2)

## 2017-01-27 MED ORDER — SODIUM CHLORIDE 0.9 % IV SOLN: 250.0000 mL | INTRAVENOUS | Status: DC | PRN

## 2017-01-27 MED ORDER — MELATONIN 3 MG PO TABS
1.0000 | ORAL_TABLET | Freq: Every day | ORAL | Status: DC
  Filled 2017-01-27 (×3): qty 1

## 2017-01-27 MED ORDER — ONDANSETRON HCL 4 MG/2ML IJ SOLN: 4.0000 mg | Freq: Four times a day (QID) | INTRAMUSCULAR | Status: DC | PRN

## 2017-01-27 MED ORDER — SODIUM CHLORIDE 0.9% FLUSH: 3.0000 mL | INTRAVENOUS | Status: DC | PRN

## 2017-01-27 MED ORDER — METOPROLOL SUCCINATE ER 25 MG PO TB24
25.0000 mg | ORAL_TABLET | Freq: Every day | ORAL | Status: DC
  Administered 2017-01-28: 25 mg via ORAL
  Filled 2017-01-27: qty 1

## 2017-01-27 MED ORDER — SENNOSIDES-DOCUSATE SODIUM 8.6-50 MG PO TABS
1.0000 | ORAL_TABLET | Freq: Every evening | ORAL | Status: DC | PRN
  Filled 2017-01-27: qty 1

## 2017-01-27 MED ORDER — PROPYLENE GLYCOL 0.6 % OP SOLN: 1.0000 [drp] | Freq: Every day | OPHTHALMIC | Status: DC

## 2017-01-27 MED ORDER — AMLODIPINE BESYLATE 10 MG PO TABS
10.0000 mg | ORAL_TABLET | Freq: Every day | ORAL | Status: DC
  Administered 2017-01-28: 10 mg via ORAL
  Filled 2017-01-27: qty 1

## 2017-01-27 MED ORDER — ALPRAZOLAM 0.25 MG PO TABS: 0.1250 mg | ORAL_TABLET | Freq: Two times a day (BID) | ORAL | Status: DC | PRN

## 2017-01-27 MED ORDER — APIXABAN 5 MG PO TABS
5.0000 mg | ORAL_TABLET | Freq: Two times a day (BID) | ORAL | Status: DC
  Administered 2017-01-27 – 2017-01-28 (×2): 5 mg via ORAL
  Filled 2017-01-27 (×3): qty 1

## 2017-01-27 MED ORDER — ALLOPURINOL 100 MG PO TABS
100.0000 mg | ORAL_TABLET | Freq: Every day | ORAL | Status: DC
  Administered 2017-01-28: 100 mg via ORAL
  Filled 2017-01-27: qty 1

## 2017-01-27 MED ORDER — BISACODYL 5 MG PO TBEC
5.0000 mg | DELAYED_RELEASE_TABLET | Freq: Every day | ORAL | Status: DC | PRN
  Filled 2017-01-27: qty 1

## 2017-01-27 MED ORDER — ACETAMINOPHEN 650 MG RE SUPP: 650.0000 mg | Freq: Four times a day (QID) | RECTAL | Status: DC | PRN

## 2017-01-27 MED ORDER — ONDANSETRON HCL 4 MG PO TABS: 4.0000 mg | ORAL_TABLET | Freq: Four times a day (QID) | ORAL | Status: DC | PRN

## 2017-01-27 MED ORDER — VITAMIN D 1000 UNITS PO TABS
1000.0000 [IU] | ORAL_TABLET | Freq: Two times a day (BID) | ORAL | Status: DC
  Administered 2017-01-27 – 2017-01-28 (×2): 1000 [IU] via ORAL
  Filled 2017-01-27 (×2): qty 1

## 2017-01-27 MED ORDER — HYPROMELLOSE (GONIOSCOPIC) 2.5 % OP SOLN: 1.0000 [drp] | Freq: Every day | OPHTHALMIC | Status: DC

## 2017-01-27 MED ORDER — SODIUM CHLORIDE 0.9% FLUSH
3.0000 mL | Freq: Two times a day (BID) | INTRAVENOUS | Status: DC
  Administered 2017-01-27 – 2017-01-28 (×2): 3 mL via INTRAVENOUS

## 2017-01-27 MED ORDER — ACETAMINOPHEN 325 MG PO TABS: 650.0000 mg | ORAL_TABLET | Freq: Four times a day (QID) | ORAL | Status: DC | PRN

## 2017-01-27 MED ORDER — MORPHINE SULFATE (PF) 4 MG/ML IV SOLN
4.0000 mg | Freq: Once | INTRAVENOUS | Status: AC
  Administered 2017-01-27: 4 mg via INTRAVENOUS
  Filled 2017-01-27: qty 1

## 2017-01-27 MED ORDER — HYDROCODONE-ACETAMINOPHEN 5-325 MG PO TABS
1.0000 | ORAL_TABLET | ORAL | Status: DC | PRN
  Administered 2017-01-27: 1 via ORAL
  Filled 2017-01-27: qty 1

## 2017-01-27 MED ORDER — LISINOPRIL 2.5 MG PO TABS
2.5000 mg | ORAL_TABLET | Freq: Every day | ORAL | Status: DC
  Administered 2017-01-28: 2.5 mg via ORAL
  Filled 2017-01-27: qty 1

## 2017-01-27 NOTE — ED Notes (Signed)
Tried ambulating pt, pt tried standing up but couldn't bare weight on right leg. Notified Dr. Alvino Chapel

## 2017-01-27 NOTE — ED Notes (Signed)
Pt states she is not weak but has pain at right thigh after leg "gave out" while she was in shower 3 days ago. Pt states that she did not fall but c/o pain and unable to bear weight on right leg since that time. Bilateral DP pulses palpated. Pt repeats story that she worked at [facility] [employer] many times.

## 2017-01-27 NOTE — ED Notes (Signed)
Patient transported to CT 

## 2017-01-27 NOTE — ED Notes (Signed)
IV team at bedsidce

## 2017-01-27 NOTE — ED Notes (Signed)
Report attempted, RN still on change shift report will call back.

## 2017-01-27 NOTE — ED Notes (Signed)
Pt refuses morphine as she says it makes her sick. Will inform MD.

## 2017-01-27 NOTE — ED Provider Notes (Signed)
  Physical Exam  BP (!) 150/80   Pulse 71   Temp 98.4 F (36.9 C)   Resp (!) 21   Ht 5\' 1"  (1.549 m)   Wt 62.1 kg (137 lb)   SpO2 96%   BMI 25.89 kg/m   Physical Exam  ED Course  Procedures  MDM Patient with hip pain and back pain. Unable to ambulate. Could not bear weight. Imaging reassuring but would benefit from admission for further workup and potentially further imaging.       Davonna Belling, MD 01/27/17 [phone]

## 2017-01-27 NOTE — ED Notes (Signed)
IV team states they wered unable to get labs with iv stick.

## 2017-01-27 NOTE — ED Notes (Signed)
Patient transported to CT. Pt to be place on hallway when back to the unit.

## 2017-01-27 NOTE — ED Provider Notes (Signed)
[facility] DEPT Provider Note   CSN: [id number] Arrival date & time: 01/27/17  0909     History   Chief Complaint Chief Complaint  Patient presents with  . Extremity Weakness    HPI [name] is a 83 y.o. female.  HPI Patient presents to the emergency room for evaluation of right leg weakness and pain.  Patient states a home health aide was helping her baby the other day. Patient had her right leg give out on her. Since that time she's had persistent pain and weakness in her right leg. It hurts for her to lift her leg. She denies any numbness. She denies any back pain. She denies any headache or upper extremity weakness or pain. Patient does have some difficulties with her speech but she states this is chronic from an old stroke. Past Medical History:  Diagnosis Date  . Atrial fibrillation, chronic ([place])   . CHF (congestive heart failure) ([place])   . CVA (cerebral infarction)   . History of breast cancer in female   . Hypertension     Patient Active Problem List   Diagnosis Date Noted  . TIA (transient ischemic attack) 05/14/2014  . Essential hypertension 05/14/2014  . Aphasia 05/14/2014  . Atrial fibrillation, unspecified 05/14/2014  . Congestive heart failure of unknown etiology ([place]) 05/14/2014    Past Surgical History:  Procedure Laterality Date  . ABDOMINAL HYSTERECTOMY    . CHOLECYSTECTOMY    . HEMORROIDECTOMY    . MASTECTOMY     right    OB History    No data available       Home Medications    Prior to Admission medications   Medication Sig Start Date End Date Taking? Authorizing Provider  acetaminophen (TYLENOL) 500 MG tablet Take 500 mg by mouth every 6 (six) hours as needed.   Yes [provider]  allopurinol (ZYLOPRIM) 100 MG tablet Take 100 mg by mouth daily.   Yes [provider]  ALPRAZolam (XANAX) 0.25 MG tablet Take 0.125-0.25 mg by mouth 2 (two) times daily as needed for anxiety.    Yes [provider]    amLODipine (NORVASC) 10 MG tablet Take 10 mg by mouth daily.   Yes [provider]  apixaban (ELIQUIS) 5 MG TABS tablet Take 1 tablet (5 mg total) by mouth 2 (two) times daily. 05/16/14  Yes Thurnell Lose, MD  cholecalciferol (VITAMIN D) 1000 UNITS tablet Take 1,000 Units by mouth 2 (two) times daily.   Yes [provider]  lisinopril (PRINIVIL,ZESTRIL) 2.5 MG tablet Take 2.5 mg by mouth daily.   Yes [provider]  metoprolol succinate (TOPROL-XL) 25 MG 24 hr tablet Take 25 mg by mouth daily.   Yes [provider]  Propylene Glycol (SYSTANE BALANCE OP) Apply 1 drop to eye daily.   Yes [provider]  Melatonin 3 MG TABS Take 1 tablet by mouth at bedtime.    [provider]    Family History No family history on file.  Social History Social History  Substance Use Topics  . Smoking status: Never Smoker  . Smokeless tobacco: Never Used  . Alcohol use No     Allergies   Codeine; Penicillins; and Sulfa antibiotics   Review of Systems Review of Systems  All other systems reviewed and are negative.    Physical Exam Updated Vital Signs BP (!) 152/82   Pulse 82   Temp 98.6 F (37 C) (Oral)   Resp (!) 24  Ht 1.549 m (5\' 1" )   Wt 62.1 kg (137 lb)   SpO2 96%   BMI 25.89 kg/m   Physical Exam  Constitutional: She is oriented to person, place, and time. She appears well-developed and well-nourished. No distress.  HENT:  Head: Normocephalic and atraumatic.  Right Ear: External ear normal.  Left Ear: External ear normal.  Mouth/Throat: Oropharynx is clear and moist.  Eyes: Conjunctivae are normal. Right eye exhibits no discharge. Left eye exhibits no discharge. No scleral icterus.  Neck: Neck supple. No tracheal deviation present.  Cardiovascular: Normal rate, regular rhythm and intact distal pulses.   Pulmonary/Chest: Effort normal and breath sounds normal. No stridor. No respiratory distress. She has no wheezes. She  has no rales.  Abdominal: Soft. Bowel sounds are normal. She exhibits no distension. There is no tenderness. There is no rebound and no guarding.  Musculoskeletal: She exhibits no edema or tenderness.  Neurological: She is alert and oriented to person, place, and time. She has normal strength. No cranial nerve deficit (no facial droop, extraocular movements intact, no slurred speech) or sensory deficit. She exhibits normal muscle tone. She displays no seizure activity. Coordination normal.  No pronator drift bilateral upper extrem, able to hold left legs off bed for 5 seconds, patient has difficulty holding her right leg off the bed but this is partly related to pain, sensation intact in all extremities, no visual field cuts, no left or right sided neglect, normal finger-nose exam bilaterally, no nystagmus noted   Skin: Skin is warm and dry. No rash noted.  Psychiatric: She has a normal mood and affect.  Nursing note and vitals reviewed.    ED Treatments / Results  Labs (all labs ordered are listed, but only abnormal results are displayed) Labs Reviewed  PROTIME-INR - Abnormal; Notable for the following:       Result Value   Prothrombin Time 15.5 (*)    All other components within normal limits  I-STAT CHEM 8, ED - Abnormal; Notable for the following:    Glucose, Bld 103 (*)    Calcium, Ion 1.11 (*)    All other components within normal limits  APTT  CBC  DIFFERENTIAL  COMPREHENSIVE METABOLIC PANEL  I-STAT TROPONIN, ED     Radiology Dg Lumbar Spine Complete  Result Date: 01/27/2017 CLINICAL DATA:  Acute onset of bilateral lower extremity weakness 4 days ago, now unable to raise the right leg. No prior stroke. EXAM: LUMBAR SPINE - COMPLETE 4+ VIEW COMPARISON:  Bone window images from CT abdomen and pelvis 04/23/2011. FINDINGS: Five non-rib-bearing lumbar vertebrae with slight grade 1 spondylolisthesis of L4 on L5 approximating 5 mm, unchanged since the prior CT. This is felt to be  degenerative in nature, as there are degenerative changes in the facet joints at L3-4, L4-5 and L5-S1. Mild-to-moderate disc space narrowing at L3-4 and L4-5, unchanged. Remaining disc spaces well-preserved. Severe generalized osseous demineralization. No fractures. Sacroiliac joints intact. IMPRESSION: 1. No acute or subacute osseous abnormality. 2. Degenerative grade 1 spondylolisthesis of L4 on L5 measuring approximately 5 mm. 3. Facet degenerative changes at L3-4, L4-5 and L5-S1. 4. Degenerative disc disease at L3-4 and L4-5. 5. Severe osseous demineralization. 6. Stable since October, 2012. Electronically Signed   By: Evangeline Dakin M.D.   On: 01/27/2017 15:51   Ct Head Wo Contrast  Result Date: 01/27/2017 CLINICAL DATA:  Right-sided weakness EXAM: CT HEAD WITHOUT CONTRAST TECHNIQUE: Contiguous axial images were obtained from the base of the skull through the  vertex without intravenous contrast. COMPARISON:  05/14/2014 FINDINGS: Brain: Mild atrophic changes and chronic white matter ischemic change is seen. Cavum septum pellucidum is noted stable from the prior exam. No acute hemorrhage, acute infarction or space-occupying mass lesion are noted. Vascular: No hyperdense vessel or unexpected calcification. Skull: Normal. Negative for fracture or focal lesion. Sinuses/Orbits: No acute finding. Other: None. IMPRESSION: Chronic atrophic and ischemic changes without acute abnormality. Electronically Signed   By: Inez Catalina M.D.   On: 01/27/2017 15:22   Dg Hip Unilat With Pelvis 2-3 Views Right  Result Date: 01/27/2017 CLINICAL DATA:  Acute onset of bilateral lower extremity weakness 4 days ago, now unable to raise the right leg. No prior stroke. EXAM: DG HIP (WITH OR WITHOUT PELVIS) 2-3V RIGHT COMPARISON:  None. FINDINGS: No evidence of acute, subacute or healed fractures. Mild to moderate axial joint space narrowing. Osseous demineralization. Included AP pelvis demonstrates symmetric mild to moderate  axial joint space narrowing in the contralateral left hip. Sacroiliac joints intact. Symphysis pubis intact with degenerative changes. IMPRESSION: 1. No acute, subacute or healed fractures. 2. Mild to moderate osteoarthritis. 3. Osseous demineralization. Electronically Signed   By: Evangeline Dakin M.D.   On: 01/27/2017 15:53    Procedures Procedures (including critical care time)  Medications Ordered in ED Medications  morphine 4 MG/ML injection 4 mg (4 mg Intravenous Given 01/27/17 1624)     Initial Impression / Assessment and Plan / ED Course  I have reviewed the triage vital signs and the nursing notes.  Pertinent labs & imaging results that were available during my care of the patient were reviewed by me and considered in my medical decision making (see chart for details).   Pt's right leg weakness is likely related to her pain in her leg.  Possible lumbar radiculopathy.  Doubt stroke or TIA.  Discussed findings with patient and sons.  Will give a dose of pain meds and see if patient is able to get up and walk after.    Final Clinical Impressions(s) / ED Diagnoses   Final diagnoses:  Pain of right lower extremity  Weakness    New Prescriptions New Prescriptions   No medications on file     Dorie Rank, MD 01/27/17 1630

## 2017-01-27 NOTE — H&P (Signed)
History and Physical    [name] CBJ:[id number] DOB: 19-Jul-1926 DOA: 01/27/2017  PCP: Willey Blade, MD   Patient coming from: Home  Chief Complaint: Right leg injury, unable to bear weight   HPI: [name] is a 83 y.o. female with medical history significant for chronic atrial fibrillation on Eliquis, history of CVA with chronic dysarthria, hypertension, and history of breast cancer, now presenting to the emergency department with right leg and hip pain and inability to bear weight following an injury on 01/23/2017. She reports that she had been in her usual state of health until 01/23/2017 when she was bathing with the assistance of a caregiver. She slipped to the floor, denies landing hard on the hip, and denies hitting her head. She was unable to get up on her own and the caregiver required assistance from the patient's son to help her up him back in the bed. Since that time, she has been unable to bear weight due to the pain. She denies any numbness in the leg, denies any headache, change in vision or hearing, or focal numbness or weakness. She also denies fevers, chills, or any recent illness. She reports that her baseline, she is able to bear weight, use a walker, but mainly relies on wheelchair.  ED Course: Upon arrival to the ED, patient is found to be afebrile, saturating well on room air, and with vitals otherwise stable. EKG features a sinus rhythm with first-degree AV nodal block and PVC. Chemistry panel is unremarkable, CBC is also unremarkable. Noncontrast head CT is notable for chronic atrophy and ischemic changes, but no acute abnormality. Radiographs of the hip and lumbar spine are negative for fracture or acute pathology, but notable for stable degenerative disease. Patient was treated with morphine in the ED. She remained hemodynamically stable and in no apparent respiratory distress, but has been unable to bear weight on the right leg. She will be observed in the  hospital for ongoing evaluation and management of right leg and hip pain with inability to bear weight.  Review of Systems:  All other systems reviewed and apart from HPI, are negative.  Past Medical History:  Diagnosis Date  . Atrial fibrillation, chronic ([place])   . CHF (congestive heart failure) ([place])   . CVA (cerebral infarction)   . History of breast cancer in female   . Hypertension     Past Surgical History:  Procedure Laterality Date  . ABDOMINAL HYSTERECTOMY    . CHOLECYSTECTOMY    . HEMORROIDECTOMY    . MASTECTOMY     right     reports that she has never smoked. She has never used smokeless tobacco. She reports that she does not drink alcohol or use drugs.  Allergies  Allergen Reactions  . Codeine Nausea Only  . Penicillins Nausea Only    Has patient had a PCN reaction causing immediate rash, facial/tongue/throat swelling, SOB or lightheadedness with hypotension: No Has patient had a PCN reaction causing severe rash involving mucus membranes or skin necrosis: No Has patient had a PCN reaction that required hospitalization: No Has patient had a PCN reaction occurring within the last 10 years: No If all of the above answers are "NO", then may proceed with Cephalosporin use.  . Sulfa Antibiotics Nausea Only    History reviewed. No pertinent family history.   Prior to Admission medications   Medication Sig Start Date End Date Taking? Authorizing Provider  acetaminophen (TYLENOL) 500 MG tablet Take 500 mg by mouth every  6 (six) hours as needed.   Yes [provider]  allopurinol (ZYLOPRIM) 100 MG tablet Take 100 mg by mouth daily.   Yes [provider]  ALPRAZolam (XANAX) 0.25 MG tablet Take 0.125-0.25 mg by mouth 2 (two) times daily as needed for anxiety.    Yes [provider]  amLODipine (NORVASC) 10 MG tablet Take 10 mg by mouth daily.   Yes [provider]  apixaban (ELIQUIS) 5 MG TABS tablet Take 1 tablet (5 mg total) by mouth  2 (two) times daily. 05/16/14  Yes Thurnell Lose, MD  cholecalciferol (VITAMIN D) 1000 UNITS tablet Take 1,000 Units by mouth 2 (two) times daily.   Yes [provider]  lisinopril (PRINIVIL,ZESTRIL) 2.5 MG tablet Take 2.5 mg by mouth daily.   Yes [provider]  metoprolol succinate (TOPROL-XL) 25 MG 24 hr tablet Take 25 mg by mouth daily.   Yes [provider]  Propylene Glycol (SYSTANE BALANCE OP) Apply 1 drop to eye daily.   Yes [provider]  Melatonin 3 MG TABS Take 1 tablet by mouth at bedtime.    [provider]    Physical Exam: Vitals:   01/27/17 1815 01/27/17 1845 01/27/17 1857 01/27/17 1900  BP: (!) 138/108 (!) 149/73 (!) 149/73 119/73  Pulse: 77 67 64 78  Resp: (!) 22 20 14  (!) 24  Temp:      TempSrc:      SpO2: 95% 95% 95% 96%  Weight:      Height:          Constitutional: NAD, calm, in apparent discomfort Eyes: PERTLA, lids and conjunctivae normal ENMT: Mucous membranes are moist. Posterior pharynx clear of any exudate or lesions.   Neck: normal, supple, no masses, no thyromegaly Respiratory: clear to auscultation bilaterally, no wheezing, no crackles. Normal respiratory effort.    Cardiovascular: S1 & S2 heard, regular rate and rhythm. No significant JVD. Abdomen: No distension, no tenderness, no masses palpated. Bowel sounds normal.  Musculoskeletal: no clubbing / cyanosis. No joint deformity upper and lower extremities. Tender at right hip and thigh, grimaces and becomes tachycardic with attempts at ranging the hip.  Skin: no significant rashes, lesions, ulcers. Warm, dry, well-perfused. Neurologic: Dysconjugate gaze and mild dysarthria (pt reports chronic). Sensation intact, DTR normal. Strength 5/5 in UE's and distal LE's; proximal RLE strength testing limited by pain.  Psychiatric: Alert and oriented x 3. Pleasant and cooperative.     Labs on Admission: I have personally reviewed following labs and imaging  studies  CBC:  Recent Labs Lab 01/27/17 1555 01/27/17 1610  WBC 6.3  --   NEUTROABS 2.9  --   HGB 13.7 15.0  HCT 41.3 44.0  MCV 81.5  --   PLT 178  --    Basic Metabolic Panel:  Recent Labs Lab 01/27/17 1555 01/27/17 1610  NA 140 141  K 4.1 4.1  CL 104 103  CO2 26  --   GLUCOSE 104* 103*  BUN 15 17  CREATININE 0.89 0.80  CALCIUM 9.5  --    GFR: Estimated Creatinine Clearance: 41.1 mL/min (by C-G formula based on SCr of 0.8 mg/dL). Liver Function Tests:  Recent Labs Lab 01/27/17 1555  AST 43*  ALT 41  ALKPHOS 74  BILITOT 0.8  PROT 7.3  ALBUMIN 3.4*   No results for input(s): LIPASE, AMYLASE in the last 168 hours. No results for input(s): AMMONIA in the last 168 hours. Coagulation Profile:  Recent Labs Lab  01/27/17 1555  INR 1.22   Cardiac Enzymes: No results for input(s): CKTOTAL, CKMB, CKMBINDEX, TROPONINI in the last 168 hours. BNP (last 3 results) No results for input(s): PROBNP in the last 8760 hours. HbA1C: No results for input(s): HGBA1C in the last 72 hours. CBG: No results for input(s): GLUCAP in the last 168 hours. Lipid Profile: No results for input(s): CHOL, HDL, LDLCALC, TRIG, CHOLHDL, LDLDIRECT in the last 72 hours. Thyroid Function Tests: No results for input(s): TSH, T4TOTAL, FREET4, T3FREE, THYROIDAB in the last 72 hours. Anemia Panel: No results for input(s): VITAMINB12, FOLATE, FERRITIN, TIBC, IRON, RETICCTPCT in the last 72 hours. Urine analysis:    Component Value Date/Time   COLORURINE YELLOW 05/14/2014 1211   APPEARANCEUR CLEAR 05/14/2014 1211   LABSPEC 1.008 05/14/2014 1211   PHURINE 5.5 05/14/2014 1211   GLUCOSEU NEGATIVE 05/14/2014 1211   HGBUR NEGATIVE 05/14/2014 1211   BILIRUBINUR NEGATIVE 05/14/2014 1211   KETONESUR NEGATIVE 05/14/2014 1211   PROTEINUR NEGATIVE 05/14/2014 1211   UROBILINOGEN 0.2 05/14/2014 1211   NITRITE NEGATIVE 05/14/2014 1211   LEUKOCYTESUR SMALL (A) 05/14/2014 1211   Sepsis  Labs: @LABRCNTIP (procalcitonin:4,lacticidven:4) )No results found for this or any previous visit (from the past 240 hour(s)).   Radiological Exams on Admission: Dg Lumbar Spine Complete  Result Date: 01/27/2017 CLINICAL DATA:  Acute onset of bilateral lower extremity weakness 4 days ago, now unable to raise the right leg. No prior stroke. EXAM: LUMBAR SPINE - COMPLETE 4+ VIEW COMPARISON:  Bone window images from CT abdomen and pelvis 04/23/2011. FINDINGS: Five non-rib-bearing lumbar vertebrae with slight grade 1 spondylolisthesis of L4 on L5 approximating 5 mm, unchanged since the prior CT. This is felt to be degenerative in nature, as there are degenerative changes in the facet joints at L3-4, L4-5 and L5-S1. Mild-to-moderate disc space narrowing at L3-4 and L4-5, unchanged. Remaining disc spaces well-preserved. Severe generalized osseous demineralization. No fractures. Sacroiliac joints intact. IMPRESSION: 1. No acute or subacute osseous abnormality. 2. Degenerative grade 1 spondylolisthesis of L4 on L5 measuring approximately 5 mm. 3. Facet degenerative changes at L3-4, L4-5 and L5-S1. 4. Degenerative disc disease at L3-4 and L4-5. 5. Severe osseous demineralization. 6. Stable since October, 2012. Electronically Signed   By: Evangeline Dakin M.D.   On: 01/27/2017 15:51   Ct Head Wo Contrast  Result Date: 01/27/2017 CLINICAL DATA:  Right-sided weakness EXAM: CT HEAD WITHOUT CONTRAST TECHNIQUE: Contiguous axial images were obtained from the base of the skull through the vertex without intravenous contrast. COMPARISON:  05/14/2014 FINDINGS: Brain: Mild atrophic changes and chronic white matter ischemic change is seen. Cavum septum pellucidum is noted stable from the prior exam. No acute hemorrhage, acute infarction or space-occupying mass lesion are noted. Vascular: No hyperdense vessel or unexpected calcification. Skull: Normal. Negative for fracture or focal lesion. Sinuses/Orbits: No acute finding.  Other: None. IMPRESSION: Chronic atrophic and ischemic changes without acute abnormality. Electronically Signed   By: Inez Catalina M.D.   On: 01/27/2017 15:22   Dg Hip Unilat With Pelvis 2-3 Views Right  Result Date: 01/27/2017 CLINICAL DATA:  Acute onset of bilateral lower extremity weakness 4 days ago, now unable to raise the right leg. No prior stroke. EXAM: DG HIP (WITH OR WITHOUT PELVIS) 2-3V RIGHT COMPARISON:  None. FINDINGS: No evidence of acute, subacute or healed fractures. Mild to moderate axial joint space narrowing. Osseous demineralization. Included AP pelvis demonstrates symmetric mild to moderate axial joint space narrowing in the contralateral left hip. Sacroiliac joints intact. Symphysis  pubis intact with degenerative changes. IMPRESSION: 1. No acute, subacute or healed fractures. 2. Mild to moderate osteoarthritis. 3. Osseous demineralization. Electronically Signed   By: Evangeline Dakin M.D.   On: 01/27/2017 15:53    EKG: Independently reviewed. Sinus rhythm, 1st degree AV nodal block, PVC.   Assessment/Plan  1. Right leg injury, inability to bear weight - Pt presents with right hip pain that has left her bed-bound for the last 4 days  - Reports slipping on 01/23/17, went down to ground slowly without hitting head or making any forceful impact, but couldn't get up and required 2-person assist to get her into bed  - She has not been able to bear weight since  - Plain films of right hip and L-spine notable for stable degenerative changes, but no fracture or acute abnormality   - Plan to check CT hip, serum CK, ask PT to eval and treat, continue prn analgesia    2. Atrial fibrillation - In a sinus rhythm on admission  - CHADS-VASc is 10 (age x2, CVA x2, HTN) - Continue Eliquis, metoprolol    3. Hypertension  - BP at goal  - Continue Toprol, lisinopril, and Norvasc   4. Hx CVA with dysarthria  - No new deficit identified, head CT negative    DVT prophylaxis: Eliquis Code  Status: DNR Family Communication: Discussed with patient Disposition Plan: Observe on med-surg Consults called: None Admission status: Observation    Vianne Bulls, MD [facility] Pager [phone]  If 7PM-7AM, please contact night-coverage [email or web address] Password [facility]  01/27/2017, 7:08 PM

## 2017-01-27 NOTE — ED Notes (Signed)
IV attempted x2 without success.

## 2017-01-27 NOTE — ED Triage Notes (Addendum)
Per son, pt "lost strength in her legs" on Thurs.  Today pt has difficulty lifting R leg.  States it is not a deficit from her previous stroke.

## 2017-01-28 ENCOUNTER — Encounter (HOSPITAL_COMMUNITY): Payer: Self-pay | Admitting: General Practice

## 2017-01-28 DIAGNOSIS — M25559 Pain in unspecified hip: Secondary | ICD-10-CM | POA: Diagnosis not present

## 2017-01-28 DIAGNOSIS — H10021 Other mucopurulent conjunctivitis, right eye: Secondary | ICD-10-CM | POA: Diagnosis not present

## 2017-01-28 DIAGNOSIS — Z66 Do not resuscitate: Secondary | ICD-10-CM | POA: Diagnosis not present

## 2017-01-28 DIAGNOSIS — Z7401 Bed confinement status: Secondary | ICD-10-CM | POA: Diagnosis not present

## 2017-01-28 DIAGNOSIS — S82153A Displaced fracture of unspecified tibial tuberosity, initial encounter for closed fracture: Secondary | ICD-10-CM | POA: Diagnosis not present

## 2017-01-28 DIAGNOSIS — Y939 Activity, unspecified: Secondary | ICD-10-CM | POA: Diagnosis not present

## 2017-01-28 DIAGNOSIS — I480 Paroxysmal atrial fibrillation: Secondary | ICD-10-CM | POA: Diagnosis not present

## 2017-01-28 DIAGNOSIS — Y999 Unspecified external cause status: Secondary | ICD-10-CM | POA: Diagnosis not present

## 2017-01-28 DIAGNOSIS — M109 Gout, unspecified: Secondary | ICD-10-CM | POA: Diagnosis not present

## 2017-01-28 DIAGNOSIS — R2681 Unsteadiness on feet: Secondary | ICD-10-CM | POA: Diagnosis not present

## 2017-01-28 DIAGNOSIS — I482 Chronic atrial fibrillation: Secondary | ICD-10-CM | POA: Diagnosis not present

## 2017-01-28 DIAGNOSIS — T148XXA Other injury of unspecified body region, initial encounter: Secondary | ICD-10-CM | POA: Diagnosis not present

## 2017-01-28 DIAGNOSIS — M810 Age-related osteoporosis without current pathological fracture: Secondary | ICD-10-CM | POA: Diagnosis not present

## 2017-01-28 DIAGNOSIS — I69320 Aphasia following cerebral infarction: Secondary | ICD-10-CM | POA: Diagnosis not present

## 2017-01-28 DIAGNOSIS — F419 Anxiety disorder, unspecified: Secondary | ICD-10-CM | POA: Diagnosis not present

## 2017-01-28 DIAGNOSIS — M1A09X0 Idiopathic chronic gout, multiple sites, without tophus (tophi): Secondary | ICD-10-CM | POA: Diagnosis not present

## 2017-01-28 DIAGNOSIS — M150 Primary generalized (osteo)arthritis: Secondary | ICD-10-CM | POA: Diagnosis not present

## 2017-01-28 DIAGNOSIS — W19XXXA Unspecified fall, initial encounter: Secondary | ICD-10-CM | POA: Diagnosis not present

## 2017-01-28 DIAGNOSIS — M79604 Pain in right leg: Secondary | ICD-10-CM | POA: Diagnosis not present

## 2017-01-28 DIAGNOSIS — R2689 Other abnormalities of gait and mobility: Secondary | ICD-10-CM | POA: Diagnosis not present

## 2017-01-28 DIAGNOSIS — S82151A Displaced fracture of right tibial tuberosity, initial encounter for closed fracture: Secondary | ICD-10-CM | POA: Diagnosis not present

## 2017-01-28 DIAGNOSIS — Y92129 Unspecified place in nursing home as the place of occurrence of the external cause: Secondary | ICD-10-CM | POA: Diagnosis not present

## 2017-01-28 DIAGNOSIS — T796XXA Traumatic ischemia of muscle, initial encounter: Secondary | ICD-10-CM | POA: Diagnosis not present

## 2017-01-28 DIAGNOSIS — S8990XA Unspecified injury of unspecified lower leg, initial encounter: Secondary | ICD-10-CM | POA: Diagnosis not present

## 2017-01-28 DIAGNOSIS — I509 Heart failure, unspecified: Secondary | ICD-10-CM | POA: Diagnosis not present

## 2017-01-28 DIAGNOSIS — M79605 Pain in left leg: Secondary | ICD-10-CM | POA: Diagnosis not present

## 2017-01-28 DIAGNOSIS — Z885 Allergy status to narcotic agent status: Secondary | ICD-10-CM | POA: Diagnosis not present

## 2017-01-28 DIAGNOSIS — E785 Hyperlipidemia, unspecified: Secondary | ICD-10-CM | POA: Diagnosis not present

## 2017-01-28 DIAGNOSIS — M25551 Pain in right hip: Secondary | ICD-10-CM | POA: Diagnosis not present

## 2017-01-28 DIAGNOSIS — S86191A Other injury of other muscle(s) and tendon(s) of posterior muscle group at lower leg level, right leg, initial encounter: Secondary | ICD-10-CM | POA: Diagnosis not present

## 2017-01-28 DIAGNOSIS — Z8673 Personal history of transient ischemic attack (TIA), and cerebral infarction without residual deficits: Secondary | ICD-10-CM | POA: Diagnosis not present

## 2017-01-28 DIAGNOSIS — W19XXXD Unspecified fall, subsequent encounter: Secondary | ICD-10-CM | POA: Diagnosis not present

## 2017-01-28 DIAGNOSIS — S8991XA Unspecified injury of right lower leg, initial encounter: Secondary | ICD-10-CM | POA: Diagnosis not present

## 2017-01-28 DIAGNOSIS — Z880 Allergy status to penicillin: Secondary | ICD-10-CM | POA: Diagnosis not present

## 2017-01-28 DIAGNOSIS — S82151S Displaced fracture of right tibial tuberosity, sequela: Secondary | ICD-10-CM | POA: Diagnosis not present

## 2017-01-28 DIAGNOSIS — M4316 Spondylolisthesis, lumbar region: Secondary | ICD-10-CM | POA: Diagnosis not present

## 2017-01-28 DIAGNOSIS — G8911 Acute pain due to trauma: Secondary | ICD-10-CM | POA: Diagnosis not present

## 2017-01-28 DIAGNOSIS — M25561 Pain in right knee: Secondary | ICD-10-CM | POA: Diagnosis not present

## 2017-01-28 DIAGNOSIS — I493 Ventricular premature depolarization: Secondary | ICD-10-CM | POA: Diagnosis not present

## 2017-01-28 DIAGNOSIS — M1611 Unilateral primary osteoarthritis, right hip: Secondary | ICD-10-CM | POA: Diagnosis not present

## 2017-01-28 DIAGNOSIS — I110 Hypertensive heart disease with heart failure: Secondary | ICD-10-CM | POA: Diagnosis not present

## 2017-01-28 DIAGNOSIS — M5136 Other intervertebral disc degeneration, lumbar region: Secondary | ICD-10-CM | POA: Diagnosis not present

## 2017-01-28 DIAGNOSIS — M6281 Muscle weakness (generalized): Secondary | ICD-10-CM | POA: Diagnosis not present

## 2017-01-28 DIAGNOSIS — Z7901 Long term (current) use of anticoagulants: Secondary | ICD-10-CM | POA: Diagnosis not present

## 2017-01-28 DIAGNOSIS — C50911 Malignant neoplasm of unspecified site of right female breast: Secondary | ICD-10-CM | POA: Diagnosis not present

## 2017-01-28 DIAGNOSIS — K602 Anal fissure, unspecified: Secondary | ICD-10-CM | POA: Diagnosis not present

## 2017-01-28 DIAGNOSIS — R262 Difficulty in walking, not elsewhere classified: Secondary | ICD-10-CM | POA: Diagnosis not present

## 2017-01-28 DIAGNOSIS — W010XXD Fall on same level from slipping, tripping and stumbling without subsequent striking against object, subsequent encounter: Secondary | ICD-10-CM | POA: Diagnosis not present

## 2017-01-28 DIAGNOSIS — Z79899 Other long term (current) drug therapy: Secondary | ICD-10-CM | POA: Diagnosis not present

## 2017-01-28 DIAGNOSIS — Z853 Personal history of malignant neoplasm of breast: Secondary | ICD-10-CM | POA: Diagnosis not present

## 2017-01-28 DIAGNOSIS — I10 Essential (primary) hypertension: Secondary | ICD-10-CM | POA: Diagnosis not present

## 2017-01-28 LAB — BASIC METABOLIC PANEL
Anion gap: 7 (ref 5–15)
BUN: 13 mg/dL (ref 6–20)
CALCIUM: 9.1 mg/dL (ref 8.9–10.3)
CO2: 28 mmol/L (ref 22–32)
CREATININE: 0.8 mg/dL (ref 0.44–1.00)
Chloride: 104 mmol/L (ref 101–111)
GFR calc Af Amer: >60 mL/min (ref >60)
GFR calc non Af Amer: >60 mL/min (ref >60)
GLUCOSE: 93 mg/dL (ref 65–99)
Potassium: 3.7 mmol/L (ref 3.5–5.1)
Sodium: 139 mmol/L (ref 135–145)

## 2017-01-28 LAB — CBC
HCT: 38.1 % (ref 36.0–46.0)
Hemoglobin: 12.3 g/dL (ref 12.0–15.0)
MCH: 26.7 pg (ref 26.0–34.0)
MCHC: 32.3 g/dL (ref 30.0–36.0)
MCV: 82.8 fL (ref 78.0–100.0)
PLATELETS: 175 10*3/uL (ref 150–400)
RBC: 4.6 MIL/uL (ref 3.87–5.11)
RDW: 14.3 % (ref 11.5–15.5)
WBC: 4.8 10*3/uL (ref 4.0–10.5)

## 2017-01-28 LAB — CK: CK TOTAL: 722 U/L — AB (ref 38–234)

## 2017-01-28 MED ORDER — SODIUM CHLORIDE 0.9 % IV SOLN: 250.0000 mL | INTRAVENOUS | Status: DC | PRN

## 2017-01-28 MED ORDER — ALPRAZOLAM 0.25 MG PO TABS: 0.1250 mg | ORAL_TABLET | Freq: Two times a day (BID) | ORAL | 0 refills | Status: DC | PRN

## 2017-01-28 MED ORDER — POLYVINYL ALCOHOL 1.4 % OP SOLN: 1.0000 [drp] | OPHTHALMIC | Status: DC | PRN

## 2017-01-28 NOTE — Social Work (Addendum)
Clinical Social Worker facilitated patient discharge including contacting patient family and facility to confirm patient discharge plans.  Clinical information faxed to facility and family agreeable with plan.    CSW arranged ambulance transport via [facility] to [facility] at 4:30pm.    RN to call [phone] to give report prior to discharge. Pt going to Room 124B.  Clinical Social Worker will sign off for now as social work intervention is no longer needed. Please consult [place] again if new need arises.  Elissa Hefty, LCSW Clinical Social Worker [phone]

## 2017-01-28 NOTE — Evaluation (Signed)
Physical Therapy Evaluation Patient Details Name: [name] MRN: [id number] DOB: August 27, 1926 Today's Date: 01/28/2017   History of Present Illness  Pt is an 83 y/o female admitted secondary to sustaining a fall at home when she was attempting to get into the shower. Pt experienced some R LE pain but all imaging was negative for any acute findings. PMH including but not limited to a-fib, CHF, CVA, HTN and breast cancer.  Clinical Impression  Pt presented sitting OOB in recliner chair, awake and willing to participate in therapy session. Prior to admission, pt reported that she primarily uses a w/c for mobility and requires assistance for bathing from a caregiver twice a week. Pt lives with her son (not present upon evaluation), but states that she can usually get into and out of her w/c independently. Pt currently requires mod A x2 to perform sit<>stand from recliner chair with multimodal cueing for technique and sequencing. Pt would continue to benefit from skilled physical therapy services at this time while admitted and after d/c to address the below listed limitations in order to improve overall safety and independence with functional mobility.     Follow Up Recommendations SNF;Supervision/Assistance - 24 hour (if pt refuses, will need 24/7 physical assistance, HHPT, [place], [facility] aide)    Equipment Recommendations  None recommended by PT    Recommendations for Other Services       Precautions / Restrictions Precautions Precautions: Fall Restrictions Weight Bearing Restrictions: No      Mobility  Bed Mobility               General bed mobility comments: pt OOB in recliner chair upon arrival  Transfers Overall transfer level: Needs assistance Equipment used: Rolling walker (2 wheeled) Transfers: Sit to/from Stand Sit to Stand: Mod assist;+2 physical assistance         General transfer comment: increased time and effort, multimodal cueing for sequencing, mod A x2 to  rise from chair on second attempt (first attempt pt was resisting and fearful)  Ambulation/Gait                Stairs            Wheelchair Mobility    Modified Rankin (Stroke Patients Only)       Balance Overall balance assessment: Needs assistance Sitting-balance support: Feet supported;Bilateral upper extremity supported Sitting balance-Leahy Scale: Poor Sitting balance - Comments: pt reliant on bilateral UEs on armrests of chair to sit without back support Postural control: Posterior lean Standing balance support: During functional activity;Bilateral upper extremity supported Standing balance-Leahy Scale: Poor Standing balance comment: pt reliant on bilateral UE supports and mod A x2                             Pertinent Vitals/Pain Pain Assessment: No/denies pain    Home Living Family/patient expects to be discharged to:: Private residence Living Arrangements: Children Available Help at Discharge: Family;Available PRN/intermittently Type of Home: House Home Access: Ramped entrance     Home Layout: One level Home Equipment: Walker - standard;Bedside commode;Grab bars - tub/shower;Wheelchair - manual;Shower seat      Prior Function Level of Independence: Needs assistance   Gait / Transfers Assistance Needed: pt primarly uses a w/c for mobility, can propel herself in w/c  ADL's / Homemaking Assistance Needed: pt has a caregiver that comes twice a week to assist her with bathing.        Hand Dominance  Extremity/Trunk Assessment   Upper Extremity Assessment Upper Extremity Assessment: Generalized weakness    Lower Extremity Assessment Lower Extremity Assessment: Generalized weakness    Cervical / Trunk Assessment Cervical / Trunk Assessment: Kyphotic  Communication   Communication: HOH  Cognition Arousal/Alertness: Awake/alert Behavior During Therapy: Anxious Overall Cognitive Status: No family/caregiver present to  determine baseline cognitive functioning Area of Impairment: Following commands;Problem solving                       Following Commands: Follows one step commands with increased time;Follows one step commands consistently     Problem Solving: Slow processing;Decreased initiation;Difficulty sequencing;Requires verbal cues;Requires tactile cues        General Comments      Exercises     Assessment/Plan    PT Assessment Patient needs continued PT services  PT Problem List Decreased strength;Decreased activity tolerance;Decreased balance;Decreased mobility;Decreased coordination;Decreased knowledge of use of DME;Decreased safety awareness       PT Treatment Interventions DME instruction;Gait training;Functional mobility training;Therapeutic activities;Therapeutic exercise;Balance training;Neuromuscular re-education;Patient/family education    PT Goals (Current goals can be found in the Care Plan section)  Acute Rehab PT Goals Patient Stated Goal: to not fall PT Goal Formulation: With patient Time For Goal Achievement: 02/11/17 Potential to Achieve Goals: Fair    Frequency Min 3X/week   Barriers to discharge        Co-evaluation               AM-PAC PT "6 Clicks" Daily Activity  Outcome Measure Difficulty turning over in bed (including adjusting bedclothes, sheets and blankets)?: Total Difficulty moving from lying on back to sitting on the side of the bed? : Total Difficulty sitting down on and standing up from a chair with arms (e.g., wheelchair, bedside commode, etc,.)?: Total Help needed moving to and from a bed to chair (including a wheelchair)?: A Lot Help needed walking in hospital room?: Total Help needed climbing 3-5 steps with a railing? : Total 6 Click Score: 7    End of Session Equipment Utilized During Treatment: Gait belt Activity Tolerance: Patient tolerated treatment well Patient left: in chair;with call bell/phone within reach;Other  (comment) (chair alarm pad in place, no box) Nurse Communication: Mobility status;Other (comment) (chair alarm pad in place, no box) PT Visit Diagnosis: Other abnormalities of gait and mobility (R26.89);Muscle weakness (generalized) (M62.81)    Time: 4037-0964 PT Time Calculation (min) (ACUTE ONLY): 26 min   Charges:   PT Evaluation $PT Eval Moderate Complexity: 1 Procedure PT Treatments $Therapeutic Activity: 8-22 mins   PT G Codes:   PT G-Codes **NOT FOR INPATIENT CLASS** Functional Assessment Tool Used: AM-PAC 6 Clicks Basic Mobility;Clinical judgement Functional Limitation: Mobility: Walking and moving around Mobility: Walking and Moving Around Current Status (R8381): At least 80 percent but less than 100 percent impaired, limited or restricted Mobility: Walking and Moving Around Goal Status [phone]): At least 40 percent but less than 60 percent impaired, limited or restricted    [facility], [place], DPT [place] 01/28/2017, 11:01 AM

## 2017-01-28 NOTE — Plan of Care (Signed)
Problem: Activity: Goal: Risk for activity intolerance will decrease Outcome: Progressing Pt able to get up from bed to chair with 2 person assist and walker. Pt unsteady and has been assessed by physical therapy as well.

## 2017-01-28 NOTE — Discharge Summary (Addendum)
Physician Discharge Summary  [name] VPX:[id number] DOB: [birth year] DOA: 01/27/2017  PCP: Willey Blade, MD  Admit date: 01/27/2017 Discharge date: 01/28/2017  Time spent: 20 minutes  Recommendations for Outpatient Follow-up:  1. Patient requires skilled services secondary to inability to ambulate and poor functional assessment by therapy 2. No med changes have been made to her home meds--suggest not using narcotics for pain control and use of high-dose Tylenol if having continued hip pain  Discharge Diagnoses:  Principal Problem:   Right leg injury, initial encounter Active Problems:   Essential hypertension   AF (paroxysmal atrial fibrillation) (HCC)   Right leg pain   Gait instability   Acute right hip pain   Inability to bear weight   Discharge Condition: Fair  Diet recommendation: Heart healthy  Filed Weights   01/27/17 0921  Weight: 62.1 kg (137 lb)    History of present illness:  83 ? htn Breast Ca? afib [place] 5 Chr sys hf ef 35-40% 2010--euvlomeic osteoporisi Prior CVA 04/2011             Aphasia from this recurwent TIA  Rectal fissures             Dr. Collene Mares scoped 10/2006--2 sessile olpy cgoput hld  Patient slipped to the floor while bathing and getting help from her 2 times a week caregiver She was unable to bear weight in the ED On admission CT head showed no changes plain hip and lumbar films negative for pathology Given morphine CT of the hip showed no fracture  She was evaluated by therapy and felt to be stable to go to a skilled facility which was arranged I have not changed any of her medications She has some difficulty with grip bilaterally secondary to severe arthritis and may need OT input as well as an outpatient    Consultations:  None  Discharge Exam: Vitals:   01/28/17 0131 01/28/17 0437  BP: 138/60 (!) 151/52  Pulse:  60  Resp:  18  Temp:  98.2 F (36.8 C)    General: Alert confused but pleasant Cardiovascular:  S1-S2 no murmur rub or gallop Respiratory: Clinically clear no added sound No lower extremity edema  Discharge Instructions   Discharge Instructions    Diet - low sodium heart healthy    Complete by:  As directed    Increase activity slowly    Complete by:  As directed      Current Discharge Medication List    CONTINUE these medications which have NOT CHANGED   Details  acetaminophen (TYLENOL) 500 MG tablet Take 500 mg by mouth every 6 (six) hours as needed.    allopurinol (ZYLOPRIM) 100 MG tablet Take 100 mg by mouth daily.    ALPRAZolam (XANAX) 0.25 MG tablet Take 0.125-0.25 mg by mouth 2 (two) times daily as needed for anxiety.     amLODipine (NORVASC) 10 MG tablet Take 10 mg by mouth daily.    apixaban (ELIQUIS) 5 MG TABS tablet Take 1 tablet (5 mg total) by mouth 2 (two) times daily. Qty: 60 tablet, Refills: 0    cholecalciferol (VITAMIN D) 1000 UNITS tablet Take 1,000 Units by mouth 2 (two) times daily.    lisinopril (PRINIVIL,ZESTRIL) 2.5 MG tablet Take 2.5 mg by mouth daily.    metoprolol succinate (TOPROL-XL) 25 MG 24 hr tablet Take 25 mg by mouth daily.    Propylene Glycol (SYSTANE BALANCE OP) Apply 1 drop to eye daily.    Melatonin 3 MG TABS Take  1 tablet by mouth at bedtime.       Allergies  Allergen Reactions  . Codeine Nausea Only  . Penicillins Nausea Only    Has patient had a PCN reaction causing immediate rash, facial/tongue/throat swelling, SOB or lightheadedness with hypotension: No Has patient had a PCN reaction causing severe rash involving mucus membranes or skin necrosis: No Has patient had a PCN reaction that required hospitalization: No Has patient had a PCN reaction occurring within the last 10 years: No If all of the above answers are "NO", then may proceed with Cephalosporin use.  . Sulfa Antibiotics Nausea Only   Contact information for after-discharge care    Destination    [facility] .   Specialty:   [place] information: [address] [place] [phone]               The results of significant diagnostics from this hospitalization (including imaging, microbiology, ancillary and laboratory) are listed below for reference.    Significant Diagnostic Studies: Dg Lumbar Spine Complete  Result Date: 01/27/2017 CLINICAL DATA:  Acute onset of bilateral lower extremity weakness 4 days ago, now unable to raise the right leg. No prior stroke. EXAM: LUMBAR SPINE - COMPLETE 4+ VIEW COMPARISON:  Bone window images from CT abdomen and pelvis 04/23/2011. FINDINGS: Five non-rib-bearing lumbar vertebrae with slight grade 1 spondylolisthesis of L4 on L5 approximating 5 mm, unchanged since the prior CT. This is felt to be degenerative in nature, as there are degenerative changes in the facet joints at L3-4, L4-5 and L5-S1. Mild-to-moderate disc space narrowing at L3-4 and L4-5, unchanged. Remaining disc spaces well-preserved. Severe generalized osseous demineralization. No fractures. Sacroiliac joints intact. IMPRESSION: 1. No acute or subacute osseous abnormality. 2. Degenerative grade 1 spondylolisthesis of L4 on L5 measuring approximately 5 mm. 3. Facet degenerative changes at L3-4, L4-5 and L5-S1. 4. Degenerative disc disease at L3-4 and L4-5. 5. Severe osseous demineralization. 6. Stable since October, 2012. Electronically Signed   By: Evangeline Dakin M.D.   On: 01/27/2017 15:51   Ct Head Wo Contrast  Result Date: 01/27/2017 CLINICAL DATA:  Right-sided weakness EXAM: CT HEAD WITHOUT CONTRAST TECHNIQUE: Contiguous axial images were obtained from the base of the skull through the vertex without intravenous contrast. COMPARISON:  05/14/2014 FINDINGS: Brain: Mild atrophic changes and chronic white matter ischemic change is seen. Cavum septum pellucidum is noted stable from the prior exam. No acute hemorrhage, acute infarction or space-occupying mass  lesion are noted. Vascular: No hyperdense vessel or unexpected calcification. Skull: Normal. Negative for fracture or focal lesion. Sinuses/Orbits: No acute finding. Other: None. IMPRESSION: Chronic atrophic and ischemic changes without acute abnormality. Electronically Signed   By: Inez Catalina M.D.   On: 01/27/2017 15:22   Ct Hip Right Wo Contrast  Result Date: 01/27/2017 CLINICAL DATA:  83 year old female with acute right hip pain and inability to weight bear since falling 2 days previously EXAM: CT OF THE RIGHT HIP WITHOUT CONTRAST TECHNIQUE: Multidetector CT imaging of the right hip was performed according to the standard protocol. Multiplanar CT image reconstructions were also generated. COMPARISON:  Radiographs of the pelvis and right hip obtained earlier today 01/27/2017 FINDINGS: Bones/Joint/Cartilage No acute fracture identified. Mild right hip joint degenerative osteoarthritis with narrowing of the superolateral joint space. Ligaments Suboptimally assessed by CT. Muscles and Tendons Within normal limits. No focal intramuscular hematoma or inflammatory stranding. Soft tissues The visualized intrapelvic contents are unremarkable. Atherosclerotic  calcifications are present throughout the visualized arterial structures. IMPRESSION: 1. No acute fracture or malalignment. 2. Atherosclerotic vascular calcifications. 3. Mild right hip joint osteoarthritis. Electronically Signed   By: Jacqulynn Cadet M.D.   On: 01/27/2017 20:30   Dg Hip Unilat With Pelvis 2-3 Views Right  Result Date: 01/27/2017 CLINICAL DATA:  Acute onset of bilateral lower extremity weakness 4 days ago, now unable to raise the right leg. No prior stroke. EXAM: DG HIP (WITH OR WITHOUT PELVIS) 2-3V RIGHT COMPARISON:  None. FINDINGS: No evidence of acute, subacute or healed fractures. Mild to moderate axial joint space narrowing. Osseous demineralization. Included AP pelvis demonstrates symmetric mild to moderate axial joint space  narrowing in the contralateral left hip. Sacroiliac joints intact. Symphysis pubis intact with degenerative changes. IMPRESSION: 1. No acute, subacute or healed fractures. 2. Mild to moderate osteoarthritis. 3. Osseous demineralization. Electronically Signed   By: Evangeline Dakin M.D.   On: 01/27/2017 15:53    Microbiology: No results found for this or any previous visit (from the past 240 hour(s)).   Labs: Basic Metabolic Panel:  Recent Labs Lab 01/27/17 1555 01/27/17 1610 01/28/17 0407  NA 140 141 139  K 4.1 4.1 3.7  CL 104 103 104  CO2 26  --  28  GLUCOSE 104* 103* 93  BUN 15 17 13   CREATININE 0.89 0.80 0.80  CALCIUM 9.5  --  9.1   Liver Function Tests:  Recent Labs Lab 01/27/17 1555  AST 43*  ALT 41  ALKPHOS 74  BILITOT 0.8  PROT 7.3  ALBUMIN 3.4*   No results for input(s): LIPASE, AMYLASE in the last 168 hours. No results for input(s): AMMONIA in the last 168 hours. CBC:  Recent Labs Lab 01/27/17 1555 01/27/17 1610 01/28/17 0407  WBC 6.3  --  4.8  NEUTROABS 2.9  --   --   HGB 13.7 15.0 12.3  HCT 41.3 44.0 38.1  MCV 81.5  --  82.8  PLT 178  --  175   Cardiac Enzymes:  Recent Labs Lab 01/27/17 1911 01/28/17 0737  CKTOTAL 822* 722*   BNP: BNP (last 3 results) No results for input(s): BNP in the last 8760 hours.  ProBNP (last 3 results) No results for input(s): PROBNP in the last 8760 hours.  CBG: No results for input(s): GLUCAP in the last 168 hours.     SignedNita Sells MD   [facility] 01/28/2017, 3:13 PM

## 2017-01-28 NOTE — Progress Notes (Signed)
Pt discharged via stretcher and transport after discharge instructions were given to pt's son. Prescription for xanax sent with transport. All pt belongings were taken by family. Pt in stable condition.

## 2017-01-28 NOTE — NC FL2 (Signed)
[place] LEVEL OF CARE SCREENING TOOL     IDENTIFICATION  Patient Name: [name] Birthdate: April 05, 1927 Sex: female Admission Date (Current Location): 01/27/2017  [facility] and [place] Number:  Herbalist and Address:  [facility], [place] [address] [place], [place] [address]      Provider Number: [id number]  Attending Physician Name and Address:  Nita Sells, MD  Relative Name and Phone Number:       Current Level of Care: Hospital Recommended Level of Care: [place] Prior Approval Number:    Date Approved/Denied: 01/28/17 PASRR Number: [id number]  Discharge Plan: SNF    Current Diagnoses: Patient Active Problem List   Diagnosis Date Noted  . Right leg pain 01/27/2017  . Gait instability 01/27/2017  . Right leg injury, initial encounter 01/27/2017  . Acute right hip pain   . Inability to bear weight   . TIA (transient ischemic attack) 05/14/2014  . Essential hypertension 05/14/2014  . Aphasia 05/14/2014  . AF (paroxysmal atrial fibrillation) ([place]) 05/14/2014  . Congestive heart failure of unknown etiology ([place]) 05/14/2014    Orientation RESPIRATION BLADDER Height & Weight     Self, Time, Situation, Place  Normal Continent Weight: 137 lb (62.1 kg) Height:  5\' 1"  (154.9 cm)  BEHAVIORAL SYMPTOMS/MOOD NEUROLOGICAL BOWEL NUTRITION STATUS      Continent Diet (See DC Summary)  AMBULATORY STATUS COMMUNICATION OF NEEDS Skin   Limited Assist Verbally Other (Comment) (Deconditioning, Weakness Left Leg)                       Personal Care Assistance Level of Assistance  Bathing, Feeding, Dressing Bathing Assistance: Maximum assistance Feeding assistance: Limited assistance Dressing Assistance: Limited assistance     Functional Limitations Info             SPECIAL CARE FACTORS FREQUENCY  PT (By licensed PT)     PT Frequency: 3x week              Contractures       Additional Factors Info  Code Status, Allergies Code Status Info: DNR Allergies Info: CODEINE, PENICILLINS, SULFA ANTIBIOTICS            Current Medications (01/28/2017):  This is the current hospital active medication list Current Facility-Administered Medications  Medication Dose Route Frequency Provider Last Rate Last Dose  . 0.9 %  sodium chloride infusion  250 mL Intravenous PRN Nita Sells, MD      . acetaminophen (TYLENOL) tablet 650 mg  650 mg Oral Q6H PRN Opyd, Ilene Qua, MD       Or  . acetaminophen (TYLENOL) suppository 650 mg  650 mg Rectal Q6H PRN Opyd, Ilene Qua, MD      . allopurinol (ZYLOPRIM) tablet 100 mg  100 mg Oral Daily Opyd, Ilene Qua, MD   100 mg at 01/28/17 0939  . ALPRAZolam (XANAX) tablet 0.125-0.25 mg  0.125-0.25 mg Oral BID PRN Opyd, Ilene Qua, MD      . amLODipine (NORVASC) tablet 10 mg  10 mg Oral Daily Opyd, Ilene Qua, MD   10 mg at 01/28/17 0939  . apixaban (ELIQUIS) tablet 5 mg  5 mg Oral BID Opyd, Ilene Qua, MD   5 mg at 01/28/17 0939  . bisacodyl (DULCOLAX) EC tablet 5 mg  5 mg Oral Daily PRN Opyd, Ilene Qua, MD      . cholecalciferol (VITAMIN D) tablet 1,000 Units  1,000  Units Oral BID Vianne Bulls, MD   1,000 Units at 01/28/17 [phone]  . HYDROcodone-acetaminophen (NORCO/VICODIN) 5-325 MG per tablet 1-2 tablet  1-2 tablet Oral Q4H PRN Opyd, Ilene Qua, MD   1 tablet at 01/27/17 2125  . lisinopril (PRINIVIL,ZESTRIL) tablet 2.5 mg  2.5 mg Oral Daily Opyd, Ilene Qua, MD   2.5 mg at 01/28/17 0939  . Melatonin TABS 3 mg  1 tablet Oral QHS Opyd, Ilene Qua, MD      . metoprolol succinate (TOPROL-XL) 24 hr tablet 25 mg  25 mg Oral Daily Opyd, Ilene Qua, MD   25 mg at 01/28/17 0939  . ondansetron (ZOFRAN) tablet 4 mg  4 mg Oral Q6H PRN Opyd, Ilene Qua, MD       Or  . ondansetron (ZOFRAN) injection 4 mg  4 mg Intravenous Q6H PRN Opyd, Ilene Qua, MD      . polyvinyl alcohol (LIQUIFILM TEARS) 1.4 % ophthalmic solution 1 drop  1 drop Both Eyes PRN  Masters, Jake Church, RPH      . senna-docusate (Senokot-S) tablet 1 tablet  1 tablet Oral QHS PRN Opyd, Ilene Qua, MD      . sodium chloride flush (NS) 0.9 % injection 3 mL  3 mL Intravenous Q12H Opyd, Ilene Qua, MD   3 mL at 01/28/17 0939  . sodium chloride flush (NS) 0.9 % injection 3 mL  3 mL Intravenous PRN Opyd, Ilene Qua, MD         Discharge Medications: Please see discharge summary for a list of discharge medications.  Relevant Imaging Results:  Relevant Lab Results:   Additional Information SS#:[id number] [place], LCSW

## 2017-01-28 NOTE — Evaluation (Signed)
Occupational Therapy Evaluation Patient Details Name: [name] MRN: [id number] DOB: May 05, 1927 Today's Date: 01/28/2017    History of Present Illness Pt is an 83 y/o female admitted secondary to sustaining a fall at home when she was attempting to get into the shower. Pt experienced some R LE pain but all imaging was negative for any acute findings. PMH including but not limited to a-fib, CHF, CVA, HTN and breast cancer.   Clinical Impression   This 83 yo female admitted with above presents to acute OT with deficits below (see OT problem list) thus affecting her PLOF of needing some A with basic ADLs, but now more A. She will benefit from acute OT with follow up OT at SNF to get back to PLOF.    Follow Up Recommendations  SNF;Supervision/Assistance - 24 hour    Equipment Recommendations  Other (comment) (TBD next venue)       Precautions / Restrictions Precautions Precautions: Fall Restrictions Weight Bearing Restrictions: No      Mobility Bed Mobility Overal bed mobility: Needs Assistance Bed Mobility: Supine to Sit;Sit to Supine     Supine to sit: Max assist Sit to supine: Max assist      Transfers Overall transfer level: Needs assistance Equipment used:  Charlaine Dalton) Transfers: Sit to/from Stand Sit to Stand: Mod assist (from raised bed)              Balance Overall balance assessment: Needs assistance Sitting-balance support: Feet supported;Bilateral upper extremity supported Sitting balance-Leahy Scale: Poor Sitting balance - Comments: pt reliant on bilateral UEs on bed; Pt quite fearful of falling so tends to have a posterior lean when first to EOB Postural control: Posterior lean Standing balance support: Bilateral upper extremity supported;During functional activity Standing balance-Leahy Scale: Poor Standing balance comment: pt reliant on bilateral UE supports and mod A with Stedy                           ADL either performed or  assessed with clinical judgement   ADL Overall ADL's : Needs assistance/impaired Eating/Feeding: Independent Eating/Feeding Details (indicate cue type and reason): supported sitting Grooming: Set up Grooming Details (indicate cue type and reason): supported sitting Upper Body Bathing: Set up Upper Body Bathing Details (indicate cue type and reason): supported sitting Lower Body Bathing: Maximal assistance Lower Body Bathing Details (indicate cue type and reason): mod A sit<>stand with Stedy from raised bed Upper Body Dressing : Minimal assistance Upper Body Dressing Details (indicate cue type and reason): supported sitting Lower Body Dressing: Total assistance Lower Body Dressing Details (indicate cue type and reason): Mod A sit<>stand with Stedy from raised bed Toilet Transfer: Moderate assistance Toilet Transfer Details (indicate cue type and reason): with Stedy Toileting- Clothing Manipulation and Hygiene: Total assistance Toileting - Clothing Manipulation Details (indicate cue type and reason): Mod A for standing with Stedy             Vision Patient Visual Report: No change from baseline              Pertinent Vitals/Pain Pain Assessment: 0-10 Pain Score: 2  Pain Location: RLE with movement Pain Descriptors / Indicators: Aching;Sore Pain Intervention(s): Limited activity within patient's tolerance;Monitored during session;Repositioned     Hand Dominance Right   Extremity/Trunk Assessment Upper Extremity Assessment Upper Extremity Assessment: Generalized weakness           Communication Communication Communication: HOH   Cognition Arousal/Alertness: Awake/alert Behavior During Therapy:  Anxious (due to fear of falling) Overall Cognitive Status: No family/caregiver present to determine baseline cognitive functioning                                                Home Living Family/patient expects to be discharged to:: Skilled nursing  facility Living Arrangements: Children Available Help at Discharge: Family;Available PRN/intermittently Type of Home: House Home Access: Ramped entrance     Home Layout: One level     Bathroom Shower/Tub: Occupational psychologist: Standard     Home Equipment: Walker - standard;Bedside commode;Grab bars - tub/shower;Wheelchair - manual;Shower seat          Prior Functioning/Environment Level of Independence: Needs assistance  Gait / Transfers Assistance Needed: pt primarly uses a w/c for mobility, can propel herself in w/c ADL's / Homemaking Assistance Needed: pt has a caregiver that comes twice a week to assist her with bathing.            OT Problem List: Decreased strength;Impaired balance (sitting and/or standing);Obesity;Pain      OT Treatment/Interventions: Self-care/ADL training;Therapeutic activities;Patient/family education;DME and/or AE instruction;Balance training    OT Goals(Current goals can be found in the care plan section) Acute Rehab OT Goals Patient Stated Goal: to not fall OT Goal Formulation: With patient Time For Goal Achievement: 02/04/17 Potential to Achieve Goals: Good ADL Goals Pt Will Perform Grooming: with set-up;with supervision;sitting (EOB unsupported) Pt Will Perform Upper Body Bathing: with set-up;with supervision;sitting (unsupported EOB) Pt Will Perform Lower Body Bathing: with mod assist (min A sit<>stand) Pt Will Transfer to Toilet: with mod assist;stand pivot transfer;bedside commode Pt Will Perform Toileting - Clothing Manipulation and hygiene:  (Pt will be able to stand with min A to A with these tasks) Additional ADL Goal #1: Pt will be Mod A OOB for basic ADLs  OT Frequency: Min 2X/week   Barriers to D/C: Decreased caregiver support             AM-PAC PT "6 Clicks" Daily Activity     Outcome Measure Help from another person eating meals?: None Help from another person taking care of personal grooming?: A  Little Help from another person toileting, which includes using toliet, bedpan, or urinal?: A Lot Help from another person bathing (including washing, rinsing, drying)?: A Lot Help from another person to put on and taking off regular upper body clothing?: A Little Help from another person to put on and taking off regular lower body clothing?: A Lot 6 Click Score: 16   End of Session Equipment Utilized During Treatment:  Charlaine Dalton)  Activity Tolerance: Patient tolerated treatment well Patient left: in bed;with call bell/phone within reach;with bed alarm set  OT Visit Diagnosis: Unsteadiness on feet (R26.81);History of falling (Z91.81)                Time: 7622-6333 OT Time Calculation (min): 14 min Charges:  OT General Charges $OT Visit: 1 Procedure OT Evaluation $OT Eval Moderate Complexity: 1 Procedure G-Codes: OT G-codes **NOT FOR INPATIENT CLASS** Functional Assessment Tool Used: Clinical judgement Functional Limitation: Self care Self Care Current Status (L4562): At least 40 percent but less than 60 percent impaired, limited or restricted Self Care Goal Status (B6389): At least 20 percent but less than 40 percent impaired, limited or restricted   [place], OTR/L [phone] 01/28/2017

## 2017-01-28 NOTE — Plan of Care (Signed)
Problem: Safety: Goal: Ability to remain free from injury will improve Outcome: Progressing Patient free from falls during this admission. Call bell within reach. Bed in low and locked position. Patient alert and oriented. Nonskid footwear being utilized. Clean and clear environment maintained. 3/4 siderails in place. Patient verbalized understanding of safety instruction.  Problem: Pain Managment: Goal: General experience of comfort will improve Outcome: Progressing Pain is being managed with PO PRN pain medication at this time.

## 2017-01-28 NOTE — Progress Notes (Signed)
Dr. Verlon Au paged regarding [facility]'s request for a xanax prescription since MD and NP are no longer at the facility for the rest of the day. Awaiting call back.

## 2017-01-28 NOTE — Clinical Social Work Note (Signed)
Clinical Social Work Assessment  Patient Details  Name: [name] MRN: [id number] Date of Birth: 29-Aug-1926  Date of referral:  01/28/17               Reason for consult:  Facility Placement                Permission sought to share information with:    Permission granted to share information::  Yes, Verbal Permission Granted  Name::     Wynonia Lawman, son  Agency::  SNF  Relationship::     Contact Information:     Housing/Transportation Living arrangements for the past 2 months:  Mobile Home Source of Information:  Patient, Adult Children Patient Interpreter Needed:  None Criminal Activity/Legal Involvement Pertinent to Current Situation/Hospitalization:  No - Comment as needed Significant Relationships:  Adult Children, Other Family Members Lives with:  Self, Adult Children Do you feel safe going back to the place where you live?  No Need for family participation in patient care:  Yes (Comment)  Care giving concerns:  Pt resides alone, however, her sons are close by as [place].  She indicates that she has home health aid about 2 days a week.  She indicated that she wants to go home but understands the need for rehab.  She indicated that the decision will be left up to her son.  CSW will f/u as it appears she may not be safe to dc home at this time.  Social Worker assessment / plan:  CSW spoke with the patient to discuss the clinical teams recommendation for short term rehab at [facility].  CSW discussed with patient who wants to go home however wld leave the decision up to her sons. Pt confirmed that she has been to [facility] in the past and would like to return to that SNF.  CSW will f/u with son and confirm short term rehab.  FL2 complete. Passr confirmed. Offers sent.  Employment status:  Retired Nurse, adult PT Recommendations:  [place] / Referral to community resources:  [place]  Patient/Family's Response to care:  Patient has no issues with care at this time.  Patient/Family's Understanding of and Emotional Response to Diagnosis, Current Treatment, and Prognosis:  Patient has good understanding of diagnosis, current treatment and prognosis and wants to go home, but understands the need for short term rehab. Pt intends on returning home after rehab. Pt/family reports no issues with care at this time.  Emotional Assessment Appearance:  Appears stated age Attitude/Demeanor/Rapport:   (Cooperative) Affect (typically observed):  Accepting, Appropriate Orientation:  Oriented to Self, Oriented to Place, Oriented to  Time, Oriented to Situation Alcohol / Substance use:  Not Applicable Psych involvement (Current and /or in the community):  No (Comment)  Discharge Needs  Concerns to be addressed:  Care Coordination Readmission within the last 30 days:  No Current discharge risk:  Dependent with Mobility, Physical Impairment Barriers to Discharge:  No Barriers Identified   Normajean Baxter, LCSW 01/28/2017, 3:19 PM

## 2017-01-28 NOTE — Clinical Social Work Placement (Signed)
   CLINICAL SOCIAL WORK PLACEMENT  NOTE  Date:  01/28/2017  Patient Details  Name: [name] MRN: [id number] Date of Birth: 1927/01/23  Clinical Social Work is seeking post-discharge placement for this patient at the [place] level of care (*CSW will initial, date and re-position this form in  chart as items are completed):  Yes   Patient/family provided with [facility] Work Department's list of facilities offering this level of care within the geographic area requested by the patient (or if unable, by the patient's family).  Yes   Patient/family informed of their freedom to choose among providers that offer the needed level of care, that participate in Medicare, Medicaid or managed care program needed by the patient, have an available bed and are willing to accept the patient.  Yes   Patient/family informed of [facility]'s ownership interest in [facility] and [facility], as well as of the fact that they are under no obligation to receive care at these facilities.  PASRR submitted to EDS on       PASRR number received on 01/28/17     Existing PASRR number confirmed on 01/28/17     FL2 transmitted to all facilities in geographic area requested by pt/family on 01/28/17     FL2 transmitted to all facilities within larger geographic area on 01/28/17     Patient informed that his/her managed care company has contracts with or will negotiate with certain facilities, including the following:        Yes   Patient/family informed of bed offers received.  Patient chooses bed at  [facility])     Physician recommends and patient chooses bed at      Patient to be transferred to  [facility]) on 01/28/17.  Patient to be transferred to facility by [facility]     Patient family notified on 01/28/17 of transfer.  Name of family member notified:  son, [name]     PHYSICIAN Please sign FL2, Please sign DNR,  Please prepare prescriptions     Additional Comment:    _______________________________________________ Normajean Baxter, LCSW 01/28/2017, 3:30 PM

## 2017-01-28 NOTE — Care Management Note (Signed)
Case Management Note  Patient Details  Name: [name] MRN: [id number] Date of Birth: 31-Mar-1927  Subjective/Objective:  83 yr old female in for observation with right leg and hip pain after a fall. Therapy has recommended Shortterm rehab at [facility].                Action/Plan: Case manager spoke with patient concerning [place] because she wanted to go Home. Referral was called to Stevie Kern, [facility] Liaison. Social Worker has spoken with patient's sons at her request and they have agreed that patient should go to shortterm rehab. Social Worker will facilitate. CM has contacted [facility] .   Expected Discharge Date:    pending              Expected Discharge Plan:  [place] In-House Referral:  NA  Discharge planning Services  CM Consult  Post Acute Care Choice:  Durable Medical Equipment, Home Health Choice offered to:  Patient  DME Arranged:  3-N-1, Walker rolling DME Agency:  [facility] Arranged:  PT, OT, Nurse's Aide [place] Agency:  [facility]  Status of Service:  Completed, signed off  If discussed at [place] of Stay Meetings, dates discussed:    Additional Comments:  Ninfa Meeker, RN 01/28/2017, 2:53 PM

## 2017-01-29 ENCOUNTER — Encounter: Payer: Self-pay | Admitting: Adult Health

## 2017-01-29 ENCOUNTER — Non-Acute Institutional Stay (SKILLED_NURSING_FACILITY): Payer: Medicare Other | Admitting: Adult Health

## 2017-01-29 DIAGNOSIS — I480 Paroxysmal atrial fibrillation: Secondary | ICD-10-CM

## 2017-01-29 DIAGNOSIS — M25551 Pain in right hip: Secondary | ICD-10-CM | POA: Diagnosis not present

## 2017-01-29 DIAGNOSIS — I10 Essential (primary) hypertension: Secondary | ICD-10-CM

## 2017-01-29 DIAGNOSIS — M1A09X0 Idiopathic chronic gout, multiple sites, without tophus (tophi): Secondary | ICD-10-CM

## 2017-01-29 DIAGNOSIS — F419 Anxiety disorder, unspecified: Secondary | ICD-10-CM | POA: Diagnosis not present

## 2017-01-29 NOTE — Progress Notes (Signed)
Location:   [place] Room Number: [place] of Service:  SNF (31)   CODE STATUS: Full Code  Allergies  Allergen Reactions  . Codeine Nausea Only  . Penicillins Nausea Only    Has patient had a PCN reaction causing immediate rash, facial/tongue/throat swelling, SOB or lightheadedness with hypotension: No Has patient had a PCN reaction causing severe rash involving mucus membranes or skin necrosis: No Has patient had a PCN reaction that required hospitalization: No Has patient had a PCN reaction occurring within the last 10 years: No If all of the above answers are "NO", then may proceed with Cephalosporin use.  . Sulfa Antibiotics Nausea Only    Chief Complaint  Patient presents with  . Hospitalization Follow-up    Hospital Follow up    HPI:  She is an 83 year old; who has been living at home with assistance 2 times per week. She suffered a fall and was unable to bear weight on her right leg. She was admitted; and a fracture was ruled out.  She is here for short term rehab with her goal to return back home. Her son is present at bedside. He tells me her long term plans are "up to [place]". I did educate him that discharge planning is a group decision. He verbalize understanding. There are no nursing concerns at this time.    Past Medical History:  Diagnosis Date  . Anginal pain ([place])   . Arthritis    "all over"  . Atrial fibrillation, chronic ([place])   . Cancer of right breast ([place])   . CHF (congestive heart failure) ([place])   . Coronary artery disease   . CVA (cerebral vascular accident) ([place])    "I've had 2; legs weak since" (01/28/2017)  . Gout    "I take RX for it" (01/28/2017)  . Hypertension   . Migraine    "when I was young"    Past Surgical History:  Procedure Laterality Date  . APPENDECTOMY    . BREAST BIOPSY Right   . BREAST LUMPECTOMY Left    "benign"  . CATARACT EXTRACTION W/ INTRAOCULAR LENS  IMPLANT, BILATERAL Bilateral   . CHOLECYSTECTOMY      . CORONARY ANGIOPLASTY WITH STENT PLACEMENT    . HEMORROIDECTOMY    . MASTECTOMY Right   . TONSILLECTOMY    . TUBAL LIGATION    . VAGINAL HYSTERECTOMY      Social History   Social History  . Marital status: Widowed    Spouse name: N/A  . Number of children: N/A  . Years of education: N/A   Occupational History  . Not on file.   Social History Main Topics  . Smoking status: Never Smoker  . Smokeless tobacco: Never Used  . Alcohol use No  . Drug use: No  . Sexual activity: No   Other Topics Concern  . Not on file   Social History Narrative  . No narrative on file   History reviewed. No pertinent family history.    VITAL SIGNS BP (!) 163/94   Pulse 80   Temp (!) 97.2 F (36.2 C)   Resp 18   Ht 5' (1.524 m)   Wt 137 lb (62.1 kg)   SpO2 94%   BMI 26.76 kg/m    Patient's Medications  New Prescriptions   No medications on file  Previous Medications   ACETAMINOPHEN (TYLENOL) 500 MG TABLET    Take 500 mg by mouth every 6 (six) hours  as needed.   ALLOPURINOL (ZYLOPRIM) 100 MG TABLET    Take 100 mg by mouth daily.   ALPRAZOLAM (XANAX) 0.25 MG TABLET    Take 0.5-1 tablets (0.125-0.25 mg total) by mouth 2 (two) times daily as needed for anxiety.   AMLODIPINE (NORVASC) 10 MG TABLET    Take 10 mg by mouth daily.   APIXABAN (ELIQUIS) 5 MG TABS TABLET    Take 1 tablet (5 mg total) by mouth 2 (two) times daily.   CHOLECALCIFEROL (VITAMIN D) 1000 UNITS TABLET    Take 1,000 Units by mouth 2 (two) times daily.   LISINOPRIL (PRINIVIL,ZESTRIL) 2.5 MG TABLET    Take 2.5 mg by mouth daily.   MELATONIN 3 MG TABS    Take 1 tablet by mouth at bedtime.   METOPROLOL SUCCINATE (TOPROL-XL) 25 MG 24 HR TABLET    Take 25 mg by mouth daily.   PROPYLENE GLYCOL (SYSTANE BALANCE OP)    Apply 1 drop to eye daily.  Modified Medications   No medications on file  Discontinued Medications   No medications on file     SIGNIFICANT DIAGNOSTIC EXAMS   REVIEWED TODAY  01-27-17: ct of head:  Chronic atrophic and ischemic changes without acute abnormality.  01-27-17: lumbar spine x-ray:  1. No acute or subacute osseous abnormality. 2. Degenerative grade 1 spondylolisthesis of L4 on L5 measuring approximately 5 mm. 3. Facet degenerative changes at L3-4, L4-5 and L5-S1. 4. Degenerative disc disease at L3-4 and L4-5. 5. Severe osseous demineralization. 6. Stable since October, 2012.  01-27-17: right hip and pelvis x-ray:  1. No acute, subacute or healed fractures. 2. Mild to moderate osteoarthritis. 3. Osseous demineralization.   01-27-17: ct scan of right hip: 1. No acute fracture or malalignment. 2. Atherosclerotic vascular calcifications. 3. Mild right hip joint osteoarthritis.    LABS REVIEWED: TODAY:   01-27-17: wbc 6.3; hgb 13.7; hct 41.3; mcv 81.5; plt 187; glucose 104; bun 15; creat 0.89; k+ 4.1; na++ 140; ca 9.5; ast 43; albumin 3.4; ck 822 01-28-17: wbc 4.8; hgb 12.3; hct 38.1; mcv 82.8; plt 175; glucose 93; bun 13; creat 0.80; k+ 3.7; na++ 139; ca 9.1; ck 722    Review of Systems  Constitutional: Negative for malaise/fatigue.  Respiratory: Negative for cough and shortness of breath.   Cardiovascular: Negative for chest pain, palpitations and leg swelling.  Gastrointestinal: Negative for abdominal pain, constipation and heartburn.  Musculoskeletal: Positive for joint pain. Negative for back pain and myalgias.       Right hip pain with movement   Skin: Negative.   Neurological: Negative for dizziness.  Psychiatric/Behavioral: The patient is nervous/anxious.        Has chronic anxiety per her family she has been taking xanax long term     Physical Exam  Constitutional: No distress.  Frail   HENT:  Has stuttering speech   Eyes: Conjunctivae are normal.  Neck: Neck supple. No JVD present. No thyromegaly present.  Cardiovascular: Normal rate and intact distal pulses.   Slightly irregular heart rhythm   Respiratory: Effort normal and breath sounds normal. No  respiratory distress. She has no wheezes.  GI: Soft. Bowel sounds are normal. She exhibits no distension. There is no tenderness.  Musculoskeletal: She exhibits no edema.  Able to move all extremities  Has pain with movement to right hip   Lymphadenopathy:    She has no cervical adenopathy.  Neurological: She is alert.  Skin: Skin is warm and dry. She is not  diaphoretic.  Psychiatric: She has a normal mood and affect.     ASSESSMENT/ PLAN:   TODAY  1. Right hip pain status post fall at home:is without change  will continue therapy as directed to improve upon her strength; gait; balance and adl training. Will change her tylenol to 650 mg three times daily and will monitor   2. Gout: is stable no recent flares: will continue allopurinol 100 mg daily   3. Hypertension: without change in status: b/p 163/94: will continue lisinopril 2.5 mg daily; norvasc 10 mg daily and toprol xl 50 mg daily   4. Afib: heart rate is stable; ( EF 40-45% 05-15-14)  will continue toprol xl 25 mg daily for rate control and is on eliqus 5 mg twice daily   5. Anxiety: is without change will begin her on zoloft 25 mg daily and will change her xanax to 0.125 mg twice daily as needed for 2 weeks; will continue to monitor her status.   Is taking melatonin 3 mg nightly for sleep.    Time spent with patient  45  minutes >50% time spent counseling; reviewing medical record; tests; labs; and developing future plan of care Counseled family of discharged process; goals of therapy and the patient's history of anxiety     MD is aware of resident's narcotic use and is in agreement with current plan of care. We will attempt to wean resident as apropriate   Ok Edwards NP [facility]  Contact [phone] Monday through Friday 8am- 5pm  After hours call [phone]

## 2017-01-30 ENCOUNTER — Non-Acute Institutional Stay (SKILLED_NURSING_FACILITY): Payer: Medicare Other | Admitting: Internal Medicine

## 2017-01-30 ENCOUNTER — Encounter: Payer: Self-pay | Admitting: Internal Medicine

## 2017-01-30 ENCOUNTER — Encounter: Payer: Self-pay | Admitting: Adult Health

## 2017-01-30 DIAGNOSIS — Y92009 Unspecified place in unspecified non-institutional (private) residence as the place of occurrence of the external cause: Secondary | ICD-10-CM | POA: Diagnosis not present

## 2017-01-30 DIAGNOSIS — F419 Anxiety disorder, unspecified: Secondary | ICD-10-CM | POA: Diagnosis not present

## 2017-01-30 DIAGNOSIS — W19XXXA Unspecified fall, initial encounter: Secondary | ICD-10-CM

## 2017-01-30 DIAGNOSIS — M1A09X0 Idiopathic chronic gout, multiple sites, without tophus (tophi): Secondary | ICD-10-CM

## 2017-01-30 DIAGNOSIS — M150 Primary generalized (osteo)arthritis: Secondary | ICD-10-CM | POA: Insufficient documentation

## 2017-01-30 DIAGNOSIS — T796XXA Traumatic ischemia of muscle, initial encounter: Secondary | ICD-10-CM

## 2017-01-30 DIAGNOSIS — H1031 Unspecified acute conjunctivitis, right eye: Secondary | ICD-10-CM

## 2017-01-30 DIAGNOSIS — I6930 Unspecified sequelae of cerebral infarction: Secondary | ICD-10-CM

## 2017-01-30 DIAGNOSIS — I10 Essential (primary) hypertension: Secondary | ICD-10-CM | POA: Diagnosis not present

## 2017-01-30 DIAGNOSIS — M159 Polyosteoarthritis, unspecified: Secondary | ICD-10-CM

## 2017-01-30 DIAGNOSIS — R5381 Other malaise: Secondary | ICD-10-CM | POA: Diagnosis not present

## 2017-01-30 DIAGNOSIS — I480 Paroxysmal atrial fibrillation: Secondary | ICD-10-CM

## 2017-01-30 DIAGNOSIS — M25561 Pain in right knee: Secondary | ICD-10-CM

## 2017-01-30 NOTE — Progress Notes (Signed)
Entered in error

## 2017-01-30 NOTE — Progress Notes (Addendum)
HISTORY AND PHYSICAL   DATE:   January 30, 2017  Location:   [facility] Room Number: [place] of Service: SNF (31)   Extended Emergency Contact Information Primary Emergency Contact: Muffley,Clyde A. Address: [place], [place] [address] [place] Mobile Phone: [phone] Relation: Son Secondary Emergency Contact: Melle,William  [place] Mobile Phone: [phone] Relation: Son  Advanced Directive information Does Patient Have a Medical Advance Directive?: Yes, Would patient like information on creating a medical advance directive?: No - Patient declined, Type of Advance Directive: Out of facility DNR (pink MOST or yellow form), Pre-existing out of facility DNR order (yellow form or pink MOST form): Yellow form placed in chart (order not valid for inpatient use), Does patient want to make changes to medical advance directive?: No - Patient declined  Chief Complaint  Patient presents with  . New Admit To SNF    New Admission    HPI:  83 yo female seen today as a new admission into SNF following hospital stay for right leg injury, acute right hip pain, inability to bear weight, unstable gait, HTN, chronic sHF, PAF, osteoporosis, hx CVA with aphasia, recurrent TIAs, hx rectal fissure, gout, hyperlipidemia, hx breast cancer. She presented to ED after she slipped to the floor while getting into the shower with the assistance of her caregiver. She c/o RLE pain. Plain films neg for fx. L spine xray showed DDD and grde 1 spondylolisthesis L4 on L5; severe osseous demineralization.  CT head revealed no acute process. CT right hip neg for fx but showed mild OA. Hgb 12.3; albumin 3.4; AST 43; Cr 0.8; CK 822-->722 at d/c. She presents to SNF for short term rehab.  Today she reports pain in Right knee with flexion. No hip pain. She takes tylenol ATC. She has expressive aphasia since CVA several yrs ago. Appetite ok. No  issues with sleeping. She gets anxious at times. No f/c.   Gout - no recent flare. Takes allopurinol 100 mg daily   Hypertension/chronic sHF - BP stable on lisinopril 2.5 mg daily; norvasc 10 mg daily and toprol xl 50 mg daily   PAF - rate controlled on Toprol XL. She takes eliquis for anticoagulation. 2D echo in 2015 revealed EF 40-45%; grade 1 DD; mild-mod LVH; mild-mod dilated RA; mild-mod MR; pulmonary artery pressure 36 mmHg.    Anxiety with associated insomnia - mood stable on zoloft 25 mg daily; xanax 0.125 mg  (takes 1/2 tab of 0.'25mg'$ ) twice daily as needed; melatonin 3 mg nightly for sleep.   Hx right breast CA - s/p mastectomy >20 yrs ago. No recurrence. Last mammogram 08/2014.  Osteoporosis - last DXA unknown  Hx CVA/recurrent TIAs - hx afib. She is on eliquis BID  Past Medical History:  Diagnosis Date  . Anginal pain ([place])   . Arthritis    "all over"  . Atrial fibrillation, chronic ([place])   . Cancer of right breast ([place])   . CHF (congestive heart failure) ([place])   . Coronary artery disease   . CVA (cerebral vascular accident) ([place])    "I've had 2; legs weak since" (01/28/2017)  . Gout    "I take RX for it" (01/28/2017)  . Hypertension   . Migraine    "when I was young"    Past Surgical History:  Procedure Laterality Date  . APPENDECTOMY    .  BREAST BIOPSY Right   . BREAST LUMPECTOMY Left    "benign"  . CATARACT EXTRACTION W/ INTRAOCULAR LENS  IMPLANT, BILATERAL Bilateral   . CHOLECYSTECTOMY    . CORONARY ANGIOPLASTY WITH STENT PLACEMENT    . HEMORROIDECTOMY    . MASTECTOMY Right   . TONSILLECTOMY    . TUBAL LIGATION    . VAGINAL HYSTERECTOMY      Patient Care Team: Willey Blade, MD as PCP - General (Internal Medicine)  Social History   Social History  . Marital status: Widowed    Spouse name: N/A  . Number of children: N/A  . Years of education: N/A   Occupational History  . Not on file.   Social History Main Topics  . Smoking status: Never  Smoker  . Smokeless tobacco: Never Used  . Alcohol use No  . Drug use: No  . Sexual activity: No   Other Topics Concern  . Not on file   Social History Narrative  . No narrative on file     reports that she has never smoked. She has never used smokeless tobacco. She reports that she does not drink alcohol or use drugs.  History reviewed. No pertinent family history. No family status information on file.     There is no immunization history on file for this patient.  Allergies  Allergen Reactions  . Codeine Nausea Only  . Penicillins Nausea Only    Has patient had a PCN reaction causing immediate rash, facial/tongue/throat swelling, SOB or lightheadedness with hypotension: No Has patient had a PCN reaction causing severe rash involving mucus membranes or skin necrosis: No Has patient had a PCN reaction that required hospitalization: No Has patient had a PCN reaction occurring within the last 10 years: No If all of the above answers are "NO", then may proceed with Cephalosporin use.  . Sulfa Antibiotics Nausea Only    Medications: Patient's Medications  New Prescriptions   No medications on file  Previous Medications   ACETAMINOPHEN (TYLENOL) 325 MG TABLET    Take 650 mg by mouth 3 (three) times daily.    ALLOPURINOL (ZYLOPRIM) 100 MG TABLET    Take 100 mg by mouth daily.   ALPRAZOLAM (XANAX) 0.25 MG TABLET    Give 0.5 tablet (0.'125mg'$ ) by mouth every 12 hours as needed for anxiety   AMLODIPINE (NORVASC) 10 MG TABLET    Take 10 mg by mouth daily.   APIXABAN (ELIQUIS) 5 MG TABS TABLET    Take 1 tablet (5 mg total) by mouth 2 (two) times daily.   CHOLECALCIFEROL (VITAMIN D) 1000 UNITS TABLET    Take 1,000 Units by mouth 2 (two) times daily.   LISINOPRIL (PRINIVIL,ZESTRIL) 2.5 MG TABLET    Take 2.5 mg by mouth daily.   MELATONIN 3 MG TABS    Take 1 tablet by mouth at bedtime.   METOPROLOL SUCCINATE (TOPROL-XL) 25 MG 24 HR TABLET    Take 25 mg by mouth daily.   POLYETHYL  GLYCOL-PROPYL GLYCOL (SYSTANE OP)    Instill 1 drop in both eyes one time a day for dry eye   SERTRALINE (ZOLOFT) 25 MG TABLET    Take 25 mg by mouth daily.  Modified Medications   No medications on file  Discontinued Medications   ALPRAZOLAM (XANAX) 0.25 MG TABLET    Take 0.5-1 tablets (0.125-0.25 mg total) by mouth 2 (two) times daily as needed for anxiety.   PROPYLENE GLYCOL (SYSTANE BALANCE OP)    Apply 1  drop to eye daily.    Review of Systems  Unable to perform ROS: Other  HENT: Positive for dental problem.   Musculoskeletal: Positive for arthralgias, gait problem and joint swelling.  Psychiatric/Behavioral: The patient is nervous/anxious.   All other systems reviewed and are negative.   Vitals:   01/30/17 1019  BP: (!) 163/94  Pulse: 80  Resp: 18  Temp: (!) 97.2 F (36.2 C)  SpO2: 94%  Weight: 137 lb (62.1 kg)  Height: 5' (1.524 m)   Body mass index is 26.76 kg/m.  Physical Exam  Constitutional: She is oriented to person, place, and time. She appears well-developed.  Frail appearing in NAD, sitting up in bed; son at bedside  HENT:  Mouth/Throat: Oropharynx is clear and moist. No oropharyngeal exudate.  Poor dentition; MMM; no oral thrush; grape sized left cheek mucosal nodule (chronic per pt)  Eyes: Pupils are equal, round, and reactive to light. Right eye exhibits discharge (green crusting). No scleral icterus.  Neck: Neck supple. Carotid bruit is not present. No tracheal deviation present. No thyromegaly present.  Cardiovascular: Normal rate, regular rhythm and intact distal pulses.   Occasional extrasystoles are present. Exam reveals no gallop and no friction rub.   Murmur heard.  Systolic murmur is present with a grade of 1/6  No LE edema b/l. no calf TTP.   Pulmonary/Chest: Effort normal and breath sounds normal. No stridor. No respiratory distress. She has no wheezes. She has no rales.  Abdominal: Soft. Normal appearance and bowel sounds are normal. She  exhibits no distension and no mass. There is no hepatomegaly. There is no tenderness. There is no rigidity, no rebound and no guarding. No hernia.  Musculoskeletal: She exhibits edema and tenderness.       Right knee: She exhibits decreased range of motion (more so with flexion) and swelling. She exhibits no effusion, no deformity and no erythema. Tenderness found. Patellar tendon tenderness noted.  Lymphadenopathy:    She has no cervical adenopathy.  Neurological: She is alert and oriented to person, place, and time. She has normal reflexes. A cranial nerve deficit is present.  Left facial drop; left hemiparesis  Skin: Skin is warm and dry. No rash noted.  Right knee contusion  Psychiatric: She has a normal mood and affect. Her behavior is normal. Judgment and thought content normal.     Labs reviewed: Admission on 01/27/2017, Discharged on 01/28/2017  Component Date Value Ref Range Status  . Prothrombin Time 01/27/2017 15.5* 11.4 - 15.2 seconds Final  . INR 01/27/2017 1.22   Final  . aPTT 01/27/2017 36  24 - 36 seconds Final  . WBC 01/27/2017 6.3  4.0 - 10.5 K/uL Final  . RBC 01/27/2017 5.07  3.87 - 5.11 MIL/uL Final  . Hemoglobin 01/27/2017 13.7  12.0 - 15.0 g/dL Final  . HCT 01/27/2017 41.3  36.0 - 46.0 % Final  . MCV 01/27/2017 81.5  78.0 - 100.0 fL Final  . MCH 01/27/2017 27.0  26.0 - 34.0 pg Final  . MCHC 01/27/2017 33.2  30.0 - 36.0 g/dL Final  . RDW 01/27/2017 13.9  11.5 - 15.5 % Final  . Platelets 01/27/2017 178  150 - 400 K/uL Final  . Neutrophils Relative % 01/27/2017 45  % Final  . Neutro Abs 01/27/2017 2.9  1.7 - 7.7 K/uL Final  . Lymphocytes Relative 01/27/2017 40  % Final  . Lymphs Abs 01/27/2017 2.5  0.7 - 4.0 K/uL Final  . Monocytes Relative 01/27/2017 10  %  Final  . Monocytes Absolute 01/27/2017 0.6  0.1 - 1.0 K/uL Final  . Eosinophils Relative 01/27/2017 5  % Final  . Eosinophils Absolute 01/27/2017 0.3  0.0 - 0.7 K/uL Final  . Basophils Relative 01/27/2017 0   % Final  . Basophils Absolute 01/27/2017 0.0  0.0 - 0.1 K/uL Final  . Sodium 01/27/2017 140  135 - 145 mmol/L Final  . Potassium 01/27/2017 4.1  3.5 - 5.1 mmol/L Final  . Chloride 01/27/2017 104  101 - 111 mmol/L Final  . CO2 01/27/2017 26  22 - 32 mmol/L Final  . Glucose, Bld 01/27/2017 104* 65 - 99 mg/dL Final  . BUN 01/27/2017 15  6 - 20 mg/dL Final  . Creatinine, Ser 01/27/2017 0.89  0.44 - 1.00 mg/dL Final  . Calcium 01/27/2017 9.5  8.9 - 10.3 mg/dL Final  . Total Protein 01/27/2017 7.3  6.5 - 8.1 g/dL Final  . Albumin 01/27/2017 3.4* 3.5 - 5.0 g/dL Final  . AST 01/27/2017 43* 15 - 41 U/L Final  . ALT 01/27/2017 41  14 - 54 U/L Final  . Alkaline Phosphatase 01/27/2017 74  38 - 126 U/L Final  . Total Bilirubin 01/27/2017 0.8  0.3 - 1.2 mg/dL Final  . GFR calc non Af Amer 01/27/2017 56* >60 mL/min Final  . GFR calc Af Amer 01/27/2017 >60  >60 mL/min Final   Comment: (NOTE) The eGFR has been calculated using the CKD EPI equation. This calculation has not been validated in all clinical situations. eGFR's persistently <60 mL/min signify possible Chronic Kidney Disease.   . Anion gap 01/27/2017 10  5 - 15 Final  . Troponin i, poc 01/27/2017 0.03  0.00 - 0.08 ng/mL Final  . Comment 3 01/27/2017          Final   Comment: Due to the release kinetics of cTnI, a negative result within the first hours of the onset of symptoms does not rule out myocardial infarction with certainty. If myocardial infarction is still suspected, repeat the test at appropriate intervals.   . Sodium 01/27/2017 141  135 - 145 mmol/L Final  . Potassium 01/27/2017 4.1  3.5 - 5.1 mmol/L Final  . Chloride 01/27/2017 103  101 - 111 mmol/L Final  . BUN 01/27/2017 17  6 - 20 mg/dL Final  . Creatinine, Ser 01/27/2017 0.80  0.44 - 1.00 mg/dL Final  . Glucose, Bld 01/27/2017 103* 65 - 99 mg/dL Final  . Calcium, Ion 01/27/2017 1.11* 1.15 - 1.40 mmol/L Final  . TCO2 01/27/2017 24  0 - 100 mmol/L Final  . Hemoglobin  01/27/2017 15.0  12.0 - 15.0 g/dL Final  . HCT 01/27/2017 44.0  36.0 - 46.0 % Final  . Sodium 01/28/2017 139  135 - 145 mmol/L Final  . Potassium 01/28/2017 3.7  3.5 - 5.1 mmol/L Final  . Chloride 01/28/2017 104  101 - 111 mmol/L Final  . CO2 01/28/2017 28  22 - 32 mmol/L Final  . Glucose, Bld 01/28/2017 93  65 - 99 mg/dL Final  . BUN 01/28/2017 13  6 - 20 mg/dL Final  . Creatinine, Ser 01/28/2017 0.80  0.44 - 1.00 mg/dL Final  . Calcium 01/28/2017 9.1  8.9 - 10.3 mg/dL Final  . GFR calc non Af Amer 01/28/2017 >60  >60 mL/min Final  . GFR calc Af Amer 01/28/2017 >60  >60 mL/min Final   Comment: (NOTE) The eGFR has been calculated using the CKD EPI equation. This calculation has not been validated in  all clinical situations. eGFR's persistently <60 mL/min signify possible Chronic Kidney Disease.   . Anion gap 01/28/2017 7  5 - 15 Final  . WBC 01/28/2017 4.8  4.0 - 10.5 K/uL Final  . RBC 01/28/2017 4.60  3.87 - 5.11 MIL/uL Final  . Hemoglobin 01/28/2017 12.3  12.0 - 15.0 g/dL Final  . HCT 01/28/2017 38.1  36.0 - 46.0 % Final  . MCV 01/28/2017 82.8  78.0 - 100.0 fL Final  . MCH 01/28/2017 26.7  26.0 - 34.0 pg Final  . MCHC 01/28/2017 32.3  30.0 - 36.0 g/dL Final  . RDW 01/28/2017 14.3  11.5 - 15.5 % Final  . Platelets 01/28/2017 175  150 - 400 K/uL Final  . Total CK 01/27/2017 822* 38 - 234 U/L Final  . Total CK 01/28/2017 722* 38 - 234 U/L Final    Dg Lumbar Spine Complete  Result Date: 01/27/2017 CLINICAL DATA:  Acute onset of bilateral lower extremity weakness 4 days ago, now unable to raise the right leg. No prior stroke. EXAM: LUMBAR SPINE - COMPLETE 4+ VIEW COMPARISON:  Bone window images from CT abdomen and pelvis 04/23/2011. FINDINGS: Five non-rib-bearing lumbar vertebrae with slight grade 1 spondylolisthesis of L4 on L5 approximating 5 mm, unchanged since the prior CT. This is felt to be degenerative in nature, as there are degenerative changes in the facet joints at L3-4,  L4-5 and L5-S1. Mild-to-moderate disc space narrowing at L3-4 and L4-5, unchanged. Remaining disc spaces well-preserved. Severe generalized osseous demineralization. No fractures. Sacroiliac joints intact. IMPRESSION: 1. No acute or subacute osseous abnormality. 2. Degenerative grade 1 spondylolisthesis of L4 on L5 measuring approximately 5 mm. 3. Facet degenerative changes at L3-4, L4-5 and L5-S1. 4. Degenerative disc disease at L3-4 and L4-5. 5. Severe osseous demineralization. 6. Stable since October, 2012. Electronically Signed   By: Evangeline Dakin M.D.   On: 01/27/2017 15:51   Ct Head Wo Contrast  Result Date: 01/27/2017 CLINICAL DATA:  Right-sided weakness EXAM: CT HEAD WITHOUT CONTRAST TECHNIQUE: Contiguous axial images were obtained from the base of the skull through the vertex without intravenous contrast. COMPARISON:  05/14/2014 FINDINGS: Brain: Mild atrophic changes and chronic white matter ischemic change is seen. Cavum septum pellucidum is noted stable from the prior exam. No acute hemorrhage, acute infarction or space-occupying mass lesion are noted. Vascular: No hyperdense vessel or unexpected calcification. Skull: Normal. Negative for fracture or focal lesion. Sinuses/Orbits: No acute finding. Other: None. IMPRESSION: Chronic atrophic and ischemic changes without acute abnormality. Electronically Signed   By: Inez Catalina M.D.   On: 01/27/2017 15:22   Ct Hip Right Wo Contrast  Result Date: 01/27/2017 CLINICAL DATA:  83 year old female with acute right hip pain and inability to weight bear since falling 2 days previously EXAM: CT OF THE RIGHT HIP WITHOUT CONTRAST TECHNIQUE: Multidetector CT imaging of the right hip was performed according to the standard protocol. Multiplanar CT image reconstructions were also generated. COMPARISON:  Radiographs of the pelvis and right hip obtained earlier today 01/27/2017 FINDINGS: Bones/Joint/Cartilage No acute fracture identified. Mild right hip joint  degenerative osteoarthritis with narrowing of the superolateral joint space. Ligaments Suboptimally assessed by CT. Muscles and Tendons Within normal limits. No focal intramuscular hematoma or inflammatory stranding. Soft tissues The visualized intrapelvic contents are unremarkable. Atherosclerotic calcifications are present throughout the visualized arterial structures. IMPRESSION: 1. No acute fracture or malalignment. 2. Atherosclerotic vascular calcifications. 3. Mild right hip joint osteoarthritis. Electronically Signed   By: Dellis Filbert.D.  On: 01/27/2017 20:30   Dg Hip Unilat With Pelvis 2-3 Views Right  Result Date: 01/27/2017 CLINICAL DATA:  Acute onset of bilateral lower extremity weakness 4 days ago, now unable to raise the right leg. No prior stroke. EXAM: DG HIP (WITH OR WITHOUT PELVIS) 2-3V RIGHT COMPARISON:  None. FINDINGS: No evidence of acute, subacute or healed fractures. Mild to moderate axial joint space narrowing. Osseous demineralization. Included AP pelvis demonstrates symmetric mild to moderate axial joint space narrowing in the contralateral left hip. Sacroiliac joints intact. Symphysis pubis intact with degenerative changes. IMPRESSION: 1. No acute, subacute or healed fractures. 2. Mild to moderate osteoarthritis. 3. Osseous demineralization. Electronically Signed   By: Evangeline Dakin M.D.   On: 01/27/2017 15:53     Assessment/Plan   ICD-10-CM   1. Acute pain of right knee M25.561   2. Traumatic rhabdomyolysis, initial encounter ([facility]) T79.6XXA    improving  3. Fall in home, initial encounter W19.XXXA    Y92.009   4. Essential hypertension I10   5. Anxiety disorder, unspecified type F41.9   6. Primary osteoarthritis involving multiple joints M15.0   7. AF (paroxysmal atrial fibrillation) ([facility]) I48.0   8. Chronic gout of multiple sites, unspecified cause M1A.09X0   9. History of CVA with residual deficit I69.30    express aphasia; left hemiparesis   10.  Physical deconditioning R53.81   11.     Right eye acute bacterial conjunctivitis          H10.31  Start tobradex 1 gtt into OD TID x 7 days  Check total CK to follow rhabdo  Check right knee xray 4 views  T/c Ortho eval   Cont PT/OT/ST as ordered  Cont current meds as ordered. Pain meds as ordered  GOAL: short term rehab and d/c home when medically appropriate. Communicated with pt and nursing.  Will follow   Ryshawn Sanzone S. Perlie Gold  [facility] [address] [place], [place] [address] [phone] Cell (Monday-Friday 8 AM - 5 PM) [phone] After 5 PM and follow prompts

## 2017-01-31 ENCOUNTER — Emergency Department (HOSPITAL_COMMUNITY)
Admission: EM | Admit: 2017-01-31 | Discharge: 2017-01-31 | Disposition: A | Discharge status: Home / Self Care | Payer: Medicare Other | Attending: Emergency Medicine | Admitting: Emergency Medicine

## 2017-01-31 ENCOUNTER — Encounter: Payer: Self-pay | Admitting: Adult Health

## 2017-01-31 ENCOUNTER — Non-Acute Institutional Stay (SKILLED_NURSING_FACILITY): Payer: Medicare Other | Admitting: Adult Health

## 2017-01-31 ENCOUNTER — Emergency Department (HOSPITAL_COMMUNITY): Payer: Medicare Other

## 2017-01-31 ENCOUNTER — Encounter (HOSPITAL_COMMUNITY): Payer: Self-pay | Admitting: *Deleted

## 2017-01-31 DIAGNOSIS — Y92129 Unspecified place in nursing home as the place of occurrence of the external cause: Secondary | ICD-10-CM | POA: Insufficient documentation

## 2017-01-31 DIAGNOSIS — S82153A Displaced fracture of unspecified tibial tuberosity, initial encounter for closed fracture: Secondary | ICD-10-CM | POA: Insufficient documentation

## 2017-01-31 DIAGNOSIS — Z880 Allergy status to penicillin: Secondary | ICD-10-CM | POA: Insufficient documentation

## 2017-01-31 DIAGNOSIS — I110 Hypertensive heart disease with heart failure: Secondary | ICD-10-CM | POA: Diagnosis not present

## 2017-01-31 DIAGNOSIS — Z885 Allergy status to narcotic agent status: Secondary | ICD-10-CM | POA: Diagnosis not present

## 2017-01-31 DIAGNOSIS — Z79899 Other long term (current) drug therapy: Secondary | ICD-10-CM | POA: Insufficient documentation

## 2017-01-31 DIAGNOSIS — Z8673 Personal history of transient ischemic attack (TIA), and cerebral infarction without residual deficits: Secondary | ICD-10-CM | POA: Insufficient documentation

## 2017-01-31 DIAGNOSIS — Z7901 Long term (current) use of anticoagulants: Secondary | ICD-10-CM | POA: Insufficient documentation

## 2017-01-31 DIAGNOSIS — Y999 Unspecified external cause status: Secondary | ICD-10-CM | POA: Insufficient documentation

## 2017-01-31 DIAGNOSIS — C50911 Malignant neoplasm of unspecified site of right female breast: Secondary | ICD-10-CM | POA: Insufficient documentation

## 2017-01-31 DIAGNOSIS — I509 Heart failure, unspecified: Secondary | ICD-10-CM | POA: Diagnosis not present

## 2017-01-31 DIAGNOSIS — W010XXD Fall on same level from slipping, tripping and stumbling without subsequent striking against object, subsequent encounter: Secondary | ICD-10-CM | POA: Insufficient documentation

## 2017-01-31 DIAGNOSIS — S82151A Displaced fracture of right tibial tuberosity, initial encounter for closed fracture: Secondary | ICD-10-CM | POA: Diagnosis not present

## 2017-01-31 DIAGNOSIS — Y939 Activity, unspecified: Secondary | ICD-10-CM | POA: Insufficient documentation

## 2017-01-31 NOTE — Progress Notes (Signed)
Location:   [place] Room Number: [place] of Service:  SNF (31)   CODE STATUS: DNR  Allergies  Allergen Reactions  . Codeine Nausea Only  . Penicillins Nausea Only    Has patient had a PCN reaction causing immediate rash, facial/tongue/throat swelling, SOB or lightheadedness with hypotension: No Has patient had a PCN reaction causing severe rash involving mucus membranes or skin necrosis: No Has patient had a PCN reaction that required hospitalization: No Has patient had a PCN reaction occurring within the last 10 years: No If all of the above answers are "NO", then may proceed with Cephalosporin use.  . Sulfa Antibiotics Nausea Only    Chief Complaint  Patient presents with  . Acute Visit    Follow up Xray Right Knee    HPI:  She had a fall prior to her hospitalization. She had right hip pain and was worked up for a right hip fracture; which was negative. She was discharged to this facility. She has continued to complain of right leg pain and right knee pain. She had an x-ray yesterday evening which demonstrated an acute anterior tibial tuberosity avulsion fracture on the lateral view. I have spoken with the DON; and we are going to send her to the ED for further evaluation.    Past Medical History:  Diagnosis Date  . Anginal pain ([place])   . Arthritis    "all over"  . Atrial fibrillation, chronic ([place])   . Cancer of right breast ([place])   . CHF (congestive heart failure) ([place])   . Coronary artery disease   . CVA (cerebral vascular accident) ([place])    "I've had 2; legs weak since" (01/28/2017)  . Gout    "I take RX for it" (01/28/2017)  . Hypertension   . Migraine    "when I was young"    Past Surgical History:  Procedure Laterality Date  . APPENDECTOMY    . BREAST BIOPSY Right   . BREAST LUMPECTOMY Left    "benign"  . CATARACT EXTRACTION W/ INTRAOCULAR LENS  IMPLANT, BILATERAL Bilateral   . CHOLECYSTECTOMY    . CORONARY ANGIOPLASTY WITH STENT  PLACEMENT    . HEMORROIDECTOMY    . MASTECTOMY Right   . TONSILLECTOMY    . TUBAL LIGATION    . VAGINAL HYSTERECTOMY      Social History   Social History  . Marital status: Widowed    Spouse name: N/A  . Number of children: N/A  . Years of education: N/A   Occupational History  . Not on file.   Social History Main Topics  . Smoking status: Never Smoker  . Smokeless tobacco: Never Used  . Alcohol use No  . Drug use: No  . Sexual activity: No   Other Topics Concern  . Not on file   Social History Narrative  . No narrative on file   History reviewed. No pertinent family history.    VITAL SIGNS BP (!) 163/94   Pulse 80   Temp (!) 97.2 F (36.2 C)   Resp 18   Ht 5' (1.524 m)   Wt 137 lb (62.1 kg)   SpO2 94%   BMI 26.76 kg/m   Patient's Medications  New Prescriptions   No medications on file  Previous Medications   ALLOPURINOL (ZYLOPRIM) 100 MG TABLET    Take 100 mg by mouth daily.   ALPRAZOLAM (XANAX) 0.25 MG TABLET    Give 0.5 tablet (0.125mg ) by mouth every  12 hours as needed for anxiety   AMLODIPINE (NORVASC) 10 MG TABLET    Take 10 mg by mouth daily.   APIXABAN (ELIQUIS) 5 MG TABS TABLET    Take 1 tablet (5 mg total) by mouth 2 (two) times daily.   CHOLECALCIFEROL (VITAMIN D) 1000 UNITS TABLET    Take 1,000 Units by mouth 2 (two) times daily.   LISINOPRIL (PRINIVIL,ZESTRIL) 2.5 MG TABLET    Take 2.5 mg by mouth daily.   MELATONIN 3 MG TABS    Take 1 tablet by mouth at bedtime.   METOPROLOL SUCCINATE (TOPROL-XL) 25 MG 24 HR TABLET    Take 25 mg by mouth daily.   POLYETHYL GLYCOL-PROPYL GLYCOL (SYSTANE OP)    Instill 1 drop in both eyes one time a day for dry eye   SERTRALINE (ZOLOFT) 25 MG TABLET    Take 25 mg by mouth daily.   TOBRAMYCIN (TOBREX) 0.3 % OPHTHALMIC SOLUTION    Place 1 drop into the right eye 3 (three) times daily.  Modified Medications   No medications on file  Discontinued Medications   ACETAMINOPHEN (TYLENOL) 325 MG TABLET    Take 650  mg by mouth 3 (three) times daily.      SIGNIFICANT DIAGNOSTIC EXAMS  PREVIOUS   01-27-17: ct of head: Chronic atrophic and ischemic changes without acute abnormality.  01-27-17: lumbar spine x-ray:  1. No acute or subacute osseous abnormality. 2. Degenerative grade 1 spondylolisthesis of L4 on L5 measuring approximately 5 mm. 3. Facet degenerative changes at L3-4, L4-5 and L5-S1. 4. Degenerative disc disease at L3-4 and L4-5. 5. Severe osseous demineralization. 6. Stable since October, 2012.  01-27-17: right hip and pelvis x-ray:  1. No acute, subacute or healed fractures. 2. Mild to moderate osteoarthritis. 3. Osseous demineralization.   01-27-17: ct scan of right hip: 1. No acute fracture or malalignment. 2. Atherosclerotic vascular calcifications. 3. Mild right hip joint osteoarthritis.    LABS REVIEWED: PREVIOUS:   01-27-17: wbc 6.3; hgb 13.7; hct 41.3; mcv 81.5; plt 187; glucose 104; bun 15; creat 0.89; k+ 4.1; na++ 140; ca 9.5; ast 43; albumin 3.4; ck 822 01-28-17: wbc 4.8; hgb 12.3; hct 38.1; mcv 82.8; plt 175; glucose 93; bun 13; creat 0.80; k+ 3.7; na++ 139; ca 9.1; ck 722  NO NEW LABS   Review of Systems  Constitutional: Negative for malaise/fatigue.  Respiratory: Negative for cough and shortness of breath.   Cardiovascular: Negative for chest pain, palpitations and leg swelling.  Gastrointestinal: Negative for abdominal pain, constipation and heartburn.  Musculoskeletal: Positive for joint pain and myalgias. Negative for back pain.       Has right leg pain   Skin: Negative.   Neurological: Negative for dizziness.  Psychiatric/Behavioral: The patient is not nervous/anxious.        Is less anxious today     Physical Exam  Constitutional: No distress.  HENT:  Has stuttering speech  Eyes: Conjunctivae are normal.  Neck: Neck supple. No JVD present. No thyromegaly present.  Cardiovascular: Normal rate and intact distal pulses.   Slight irregular heart rate     Respiratory: Effort normal and breath sounds normal. No respiratory distress. She has no wheezes.  GI: Soft. Bowel sounds are normal. She exhibits no distension. There is no tenderness.  Musculoskeletal: She exhibits no edema.  Able to move extremities  Has pain to right leg  Lymphadenopathy:    She has no cervical adenopathy.  Neurological: She is alert.  Skin: Skin  is warm and dry. She is not diaphoretic.  Psychiatric: She has a normal mood and affect.    ASSESSMENT/ PLAN:   1. Acute right anterior tibial tuberosity  fracture: will send her to the ED for further evaluation.     PREVIOUS   1. Right hip pain status post fall at home:is without change  will continue therapy as directed to improve upon her strength; gait; balance and adl training. Will change her tylenol to 650 mg three times daily and will monitor   2. Gout: is stable no recent flares: will continue allopurinol 100 mg daily   3. Hypertension: without change in status: b/p 163/94: will continue lisinopril 2.5 mg daily; norvasc 10 mg daily and toprol xl 50 mg daily   4. Afib: heart rate is stable; ( EF 40-45% 05-15-14)  will continue toprol xl 25 mg daily for rate control and is on eliqus 5 mg twice daily   5. Anxiety: is without change will begin her on zoloft 25 mg daily and will change her xanax to 0.125 mg twice daily as needed for 2 weeks; will continue to monitor her status.   Is taking melatonin 3 mg nightly for sleep.    Time spent with patient  45  minutes >50% time spent counseling; reviewing medical record; tests; labs; and developing future plan of care Discussed plan of care with nursing staff and with patient who verbalized understanding; family also informed.    MD is aware of resident's narcotic use and is in agreement with current plan of care. We will attempt to wean resident as apropriate     Ok Edwards NP [facility]  Contact [phone] Monday through Friday 8am- 5pm  After  hours call [phone]

## 2017-01-31 NOTE — ED Provider Notes (Signed)
[facility] DEPT Provider Note   CSN: [id number] Arrival date & time: 01/31/17  1053     History   Chief Complaint Chief Complaint  Patient presents with  . Leg Pain    HPI [name] is a 83 y.o. female.  Pt presents to the ED from the SNF because she had a xray at the SNF which showed a fx on the right knee.  The pt fell on 7/23 and came to the ED with right hip pain.  She was admitted overnight and sent to SNF.  She had xrays and CT of hip and was unable to walk which is why she was sent there.  The SNF took an xray of her right knee which showed a right tibial tuberosity fracture.  They sent her here for eval.  Pt denies any new fall.        Past Medical History:  Diagnosis Date  . Anginal pain ([place])   . Arthritis    "all over"  . Atrial fibrillation, chronic ([place])   . Cancer of right breast ([place])   . CHF (congestive heart failure) ([place])   . Coronary artery disease   . CVA (cerebral vascular accident) ([place])    "I've had 2; legs weak since" (01/28/2017)  . Gout    "I take RX for it" (01/28/2017)  . Hypertension   . Migraine    "when I was young"    Patient Active Problem List   Diagnosis Date Noted  . Displaced fracture of right tibial tuberosity, initial encounter for closed fracture 01/31/2017  . History of CVA with residual deficit 01/30/2017  . Primary osteoarthritis involving multiple joints 01/30/2017  . Anxiety disorder 01/29/2017  . Chronic gout of multiple sites 01/29/2017  . Right leg pain 01/27/2017  . Gait instability 01/27/2017  . Acute right hip pain   . Inability to bear weight   . TIA (transient ischemic attack) 05/14/2014  . Essential hypertension 05/14/2014  . Aphasia 05/14/2014  . AF (paroxysmal atrial fibrillation) ([place]) 05/14/2014  . Congestive heart failure of unknown etiology ([place]) 05/14/2014    Past Surgical History:  Procedure Laterality Date  . APPENDECTOMY    . BREAST BIOPSY Right   . BREAST LUMPECTOMY Left    "benign"  . CATARACT EXTRACTION W/ INTRAOCULAR LENS  IMPLANT, BILATERAL Bilateral   . CHOLECYSTECTOMY    . CORONARY ANGIOPLASTY WITH STENT PLACEMENT    . HEMORROIDECTOMY    . MASTECTOMY Right   . TONSILLECTOMY    . TUBAL LIGATION    . VAGINAL HYSTERECTOMY      OB History    No data available       Home Medications    Prior to Admission medications   Medication Sig Start Date End Date Taking? Authorizing Provider  allopurinol (ZYLOPRIM) 100 MG tablet Take 100 mg by mouth daily.    [provider]  ALPRAZolam Duanne Moron) 0.25 MG tablet Give 0.5 tablet (0.125mg ) by mouth every 12 hours as needed for anxiety    [provider]  amLODipine (NORVASC) 10 MG tablet Take 10 mg by mouth daily.    [provider]  apixaban (ELIQUIS) 5 MG TABS tablet Take 1 tablet (5 mg total) by mouth 2 (two) times daily. 05/16/14   Thurnell Lose, MD  cholecalciferol (VITAMIN D) 1000 UNITS tablet Take 1,000 Units by mouth 2 (two) times daily.    [provider]  lisinopril (PRINIVIL,ZESTRIL) 2.5 MG tablet Take 2.5 mg by mouth daily.  [provider]  Melatonin 3 MG TABS Take 1 tablet by mouth at bedtime.    [provider]  metoprolol succinate (TOPROL-XL) 25 MG 24 hr tablet Take 25 mg by mouth daily.    [provider]  Polyethyl Glycol-Propyl Glycol (SYSTANE OP) Instill 1 drop in both eyes one time a day for dry eye    [provider]  sertraline (ZOLOFT) 25 MG tablet Take 25 mg by mouth daily.    [provider]  tobramycin (TOBREX) 0.3 % ophthalmic solution Place 1 drop into the right eye 3 (three) times daily. 01/31/17 02/07/17  [provider]    Family History No family history on file.  Social History Social History  Substance Use Topics  . Smoking status: Never Smoker  . Smokeless tobacco: Never Used  . Alcohol use No     Allergies   Codeine; Penicillins; and Sulfa antibiotics   Review of  Systems Review of Systems  Musculoskeletal:       Right knee pain  All other systems reviewed and are negative.    Physical Exam Updated Vital Signs BP 136/71 (BP Location: Left Arm)   Pulse 75   Temp 98.7 F (37.1 C) (Oral)   Resp 16   SpO2 98%   Physical Exam  Constitutional: She is oriented to person, place, and time. She appears well-developed and well-nourished.  HENT:  Head: Normocephalic and atraumatic.  Right Ear: External ear normal.  Left Ear: External ear normal.  Nose: Nose normal.  Mouth/Throat: Oropharynx is clear and moist.  Eyes: Pupils are equal, round, and reactive to light. Conjunctivae and EOM are normal.  Neck: Normal range of motion. Neck supple.  Cardiovascular: Normal rate, regular rhythm, normal heart sounds and intact distal pulses.   Pulmonary/Chest: Effort normal and breath sounds normal.  Abdominal: Soft. Bowel sounds are normal.  Musculoskeletal:       Legs: Neurological: She is alert and oriented to person, place, and time.  Skin: Skin is warm.  Psychiatric: She has a normal mood and affect. Her behavior is normal. Judgment and thought content normal.  Nursing note and vitals reviewed.    ED Treatments / Results  Labs (all labs ordered are listed, but only abnormal results are displayed) Labs Reviewed - No data to display  EKG  EKG Interpretation None       Radiology Dg Knee Complete 4 Views Right  Result Date: 01/31/2017 CLINICAL DATA:  83 year old female with a history of pain/injury EXAM: RIGHT KNEE - COMPLETE 4+ VIEW COMPARISON:  None. FINDINGS: Lateral view demonstrates avulsion fracture at the tibial tuberosity. No joint effusion.  Degenerative changes of the knee joint. Patellofemoral joint relatively aligned. IMPRESSION: Acute avulsion fracture at the tibial tuberosity. Electronically Signed   By: Corrie Mckusick D.O.   On: 01/31/2017 15:03    Procedures Procedures (including critical care time)  Medications Ordered in  ED Medications - No data to display   Initial Impression / Assessment and Plan / ED Course  I have reviewed the triage vital signs and the nursing notes.  Pertinent labs & imaging results that were available during my care of the patient were reviewed by me and considered in my medical decision making (see chart for details).    Pt placed in a knee immobilizer and referred to ortho.  Pt knows to return if worse.  Final Clinical Impressions(s) / ED Diagnoses   Final diagnoses:  Avulsion fracture of tibial tuberosity    New  Prescriptions New Prescriptions   No medications on file     Isla Pence, MD 01/31/17 1536

## 2017-01-31 NOTE — Progress Notes (Signed)
Orthopedic Tech Progress Note Patient Details:  [name] [birth year] [id number]  Ortho Devices Type of Ortho Device: Knee Immobilizer Ortho Device/Splint Location: RLE Ortho Device/Splint Interventions: Ordered, Application   Braulio Bosch 01/31/2017, 4:15 PM

## 2017-01-31 NOTE — ED Notes (Signed)
ptar here to take back to [facility]

## 2017-01-31 NOTE — ED Notes (Signed)
Patient states she needs to go to bathroom.  Patient in wheelchair.  Wheeled to bathroom and required 2 RN to transfer to toilet and back to wheelchair.  Patient had BM but no urine noted.

## 2017-01-31 NOTE — ED Notes (Signed)
orto tech coming to apply  Knee immobilizer

## 2017-01-31 NOTE — Progress Notes (Addendum)
Tammy from [facility]  SNF called and stated pt had D/C'd from [facility] due to a fall and went to [facility].  After arriving at the SNF pt was X-rayed again and it was discovered pt had a hip fracture, which was "previously unknown".  Tammy is requesting a plan of care.  CSW provided Tammy with the [facility] ED phone number and she will follow up with pt's RN.  Please reconsult if future social work needs arise.  CSW signing off, as social work intervention is no longer needed.  Alphonse Guild. Fatuma Dowers, Reed Pandy, CSI Clinical Social Worker Ph: [phone]
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
## 2017-01-31 NOTE — ED Notes (Signed)
Moved to hallway to wait for ptar

## 2017-01-31 NOTE — Discharge Instructions (Signed)
Weightbearing as tolerated!

## 2017-01-31 NOTE — ED Notes (Signed)
Report given to tameca  At [facility]  ptar called to transport back

## 2017-02-04 ENCOUNTER — Ambulatory Visit (INDEPENDENT_AMBULATORY_CARE_PROVIDER_SITE_OTHER): Payer: Medicare Other | Admitting: Orthopaedic Surgery

## 2017-02-04 DIAGNOSIS — S82151A Displaced fracture of right tibial tuberosity, initial encounter for closed fracture: Secondary | ICD-10-CM

## 2017-02-04 NOTE — Progress Notes (Signed)
Office Visit Note   Patient: [name]           Date of Birth: 27-Nov-1926           MRN: [id number] Visit Date: 02/04/2017              Requested by: Willey Blade, [address] [place], [place] [address] PCP: Willey Blade, MD   Assessment & Plan: Visit Diagnoses:  1. Closed displaced fracture of right tibial tuberosity, initial encounter     Plan: We will plan on treating this nonoperatively and a extended Bledsoe brace. She may weight-bear in full extension in the brace. We'll see her back in 3 weeks with 2 view x-rays of the right knee.  Follow-Up Instructions: Return in about 3 weeks (around 02/25/2017).   Orders:  No orders of the defined types were placed in this encounter.  No orders of the defined types were placed in this encounter.     Procedures: No procedures performed   Clinical Data: No additional findings.   Subjective: No chief complaint on file.   Patient is a 83 year old female who presents here with a minimally displaced tibial tubercle fracture on her right side from 02/01/2017.  She denies any significant pain. She denies any numbness. Pain does not radiate.     Review of Systems  Constitutional: Negative.   HENT: Negative.   Eyes: Negative.   Respiratory: Negative.   Cardiovascular: Negative.   Endocrine: Negative.   Musculoskeletal: Negative.   Neurological: Negative.   Hematological: Negative.   Psychiatric/Behavioral: Negative.   All other systems reviewed and are negative.    Objective: Vital Signs: There were no vitals taken for this visit.  Physical Exam  Constitutional: She is oriented to person, place, and time. She appears well-developed and well-nourished.  HENT:  Head: Normocephalic and atraumatic.  Eyes: EOM are normal.  Neck: Neck supple.  Pulmonary/Chest: Effort normal.  Abdominal: Soft.  Neurological: She is alert and oriented to person, place, and time.  Skin: Skin is warm.  Capillary refill takes less than 2 seconds.  Psychiatric: She has a normal mood and affect. Her behavior is normal. Judgment and thought content normal.  Nursing note and vitals reviewed.   Ortho Exam Right knee exam shows intact extensor mechanism with mild pain. She is able to extend her knee. She does have tenderness that's mild of the tibial tubercle. Compartments soft. Specialty Comments:  No specialty comments available.  Imaging: No results found.   PMFS History: Patient Active Problem List   Diagnosis Date Noted  . Displaced fracture of right tibial tuberosity, initial encounter for closed fracture 01/31/2017  . History of CVA with residual deficit 01/30/2017  . Primary osteoarthritis involving multiple joints 01/30/2017  . Anxiety disorder 01/29/2017  . Chronic gout of multiple sites 01/29/2017  . Right leg pain 01/27/2017  . Gait instability 01/27/2017  . Acute right hip pain   . Inability to bear weight   . TIA (transient ischemic attack) 05/14/2014  . Essential hypertension 05/14/2014  . Aphasia 05/14/2014  . AF (paroxysmal atrial fibrillation) ([place]) 05/14/2014  . Congestive heart failure of unknown etiology ([place]) 05/14/2014   Past Medical History:  Diagnosis Date  . Anginal pain ([place])   . Arthritis    "all over"  . Atrial fibrillation, chronic ([place])   . Cancer of right breast ([place])   . CHF (congestive heart failure) ([place])   . Coronary artery disease   . CVA (cerebral  vascular accident) ([place])    "I've had 2; legs weak since" (01/28/2017)  . Gout    "I take RX for it" (01/28/2017)  . Hypertension   . Migraine    "when I was young"    No family history on file.  Past Surgical History:  Procedure Laterality Date  . APPENDECTOMY    . BREAST BIOPSY Right   . BREAST LUMPECTOMY Left    "benign"  . CATARACT EXTRACTION W/ INTRAOCULAR LENS  IMPLANT, BILATERAL Bilateral   . CHOLECYSTECTOMY    . CORONARY ANGIOPLASTY WITH STENT PLACEMENT    . HEMORROIDECTOMY      . MASTECTOMY Right   . TONSILLECTOMY    . TUBAL LIGATION    . VAGINAL HYSTERECTOMY     Social History   Occupational History  . Not on file.   Social History Main Topics  . Smoking status: Never Smoker  . Smokeless tobacco: Never Used  . Alcohol use No  . Drug use: No  . Sexual activity: No

## 2017-02-14 ENCOUNTER — Encounter: Payer: Self-pay | Admitting: Adult Health

## 2017-02-14 ENCOUNTER — Non-Acute Institutional Stay (SKILLED_NURSING_FACILITY): Payer: Medicare Other | Admitting: Adult Health

## 2017-02-14 DIAGNOSIS — S82151S Displaced fracture of right tibial tuberosity, sequela: Secondary | ICD-10-CM | POA: Diagnosis not present

## 2017-02-14 DIAGNOSIS — I480 Paroxysmal atrial fibrillation: Secondary | ICD-10-CM

## 2017-02-14 DIAGNOSIS — R2681 Unsteadiness on feet: Secondary | ICD-10-CM | POA: Diagnosis not present

## 2017-02-14 DIAGNOSIS — M25551 Pain in right hip: Secondary | ICD-10-CM

## 2017-02-14 NOTE — Progress Notes (Signed)
Location:   [facility] Room Number: [place] of Service:  SNF (31)   CODE STATUS: DNR  Allergies  Allergen Reactions  . Codeine Nausea Only  . Penicillins Nausea Only    Has patient had a PCN reaction causing immediate rash, facial/tongue/throat swelling, SOB or lightheadedness with hypotension: No Has patient had a PCN reaction causing severe rash involving mucus membranes or skin necrosis: No Has patient had a PCN reaction that required hospitalization: No Has patient had a PCN reaction occurring within the last 10 years: No If all of the above answers are "NO", then may proceed with Cephalosporin use.  . Sulfa Antibiotics Nausea Only    Chief Complaint  Patient presents with  . Discharge Note    Discharging to home    HPI:  She had suffered a fall at home; had increased weakness and right leg pain. She was admitted to the hospital and had her right hip work up with no fracture. She was admitted to this facility for short term rehab. She continued to right leg pain and was found to have a right knee fracture. She will be following up with orthopedic.  She is now ready to return back home. She will need home health for pt/ot/rn. She will not require any dme; she will need to follow up with her medical provider.    Past Medical History:  Diagnosis Date  . Anginal pain ([place])   . Arthritis    "all over"  . Atrial fibrillation, chronic ([place])   . Cancer of right breast ([place])   . CHF (congestive heart failure) ([place])   . Coronary artery disease   . CVA (cerebral vascular accident) ([place])    "I've had 2; legs weak since" (01/28/2017)  . Gout    "I take RX for it" (01/28/2017)  . Hypertension   . Migraine    "when I was young"    Past Surgical History:  Procedure Laterality Date  . APPENDECTOMY    . BREAST BIOPSY Right   . BREAST LUMPECTOMY Left    "benign"  . CATARACT EXTRACTION W/ INTRAOCULAR LENS  IMPLANT, BILATERAL Bilateral   . CHOLECYSTECTOMY    .  CORONARY ANGIOPLASTY WITH STENT PLACEMENT    . HEMORROIDECTOMY    . MASTECTOMY Right   . TONSILLECTOMY    . TUBAL LIGATION    . VAGINAL HYSTERECTOMY      Social History   Social History  . Marital status: Widowed    Spouse name: N/A  . Number of children: N/A  . Years of education: N/A   Occupational History  . Not on file.   Social History Main Topics  . Smoking status: Never Smoker  . Smokeless tobacco: Never Used  . Alcohol use No  . Drug use: No  . Sexual activity: No   Other Topics Concern  . Not on file   Social History Narrative  . No narrative on file   History reviewed. No pertinent family history.    VITAL SIGNS BP (!) 108/50   Pulse (!) 52   Temp 98.4 F (36.9 C)   Resp 18   Ht 5' (1.524 m)   Wt 144 lb 9.6 oz (65.6 kg)   SpO2 93%   BMI 28.24 kg/m   Patient's Medications  New Prescriptions   No medications on file  Previous Medications   ACETAMINOPHEN (TYLENOL) 325 MG TABLET    Take 650 mg by mouth 3 (three) times daily.  ALLOPURINOL (ZYLOPRIM) 100 MG TABLET    Take 100 mg by mouth daily.   ALPRAZOLAM (XANAX) 0.25 MG TABLET    Give 0.5 tablet by mouth every 12 hours as needed for anxiety   AMLODIPINE (NORVASC) 10 MG TABLET    Take 10 mg by mouth daily.   APIXABAN (ELIQUIS) 5 MG TABS TABLET    Take 1 tablet (5 mg total) by mouth 2 (two) times daily.   CHOLECALCIFEROL (VITAMIN D) 1000 UNITS TABLET    Take 1,000 Units by mouth 2 (two) times daily.   LISINOPRIL (PRINIVIL,ZESTRIL) 2.5 MG TABLET    Take 2.5 mg by mouth daily.   MELATONIN 3 MG TABS    Take 1 tablet by mouth at bedtime.   METOPROLOL SUCCINATE (TOPROL-XL) 25 MG 24 HR TABLET    Take 25 mg by mouth daily.   ONDANSETRON (ZOFRAN) 4 MG TABLET    Take 4 mg by mouth every 6 (six) hours as needed for nausea or vomiting.   POLYETHYL GLYCOL-PROPYL GLYCOL (SYSTANE OP)    Instill 1 drop in both eyes one time a day for dry eye   POLYETHYLENE GLYCOL (MIRALAX / GLYCOLAX) PACKET    Take 17 g by mouth  daily.   SERTRALINE (ZOLOFT) 25 MG TABLET    Take 25 mg by mouth daily.  Modified Medications   No medications on file  Discontinued Medications   No medications on file     SIGNIFICANT DIAGNOSTIC EXAMS  PREVIOUS   01-27-17: ct of head: Chronic atrophic and ischemic changes without acute abnormality.  01-27-17: lumbar spine x-ray:  1. No acute or subacute osseous abnormality. 2. Degenerative grade 1 spondylolisthesis of L4 on L5 measuring approximately 5 mm. 3. Facet degenerative changes at L3-4, L4-5 and L5-S1. 4. Degenerative disc disease at L3-4 and L4-5. 5. Severe osseous demineralization. 6. Stable since October, 2012.  01-27-17: right hip and pelvis x-ray:  1. No acute, subacute or healed fractures. 2. Mild to moderate osteoarthritis. 3. Osseous demineralization.   01-27-17: ct scan of right hip: 1. No acute fracture or malalignment. 2. Atherosclerotic vascular calcifications. 3. Mild right hip joint osteoarthritis.  TODAY:   01-31-17: right knee x-ray: Acute avulsion fracture at the tibial tuberosity.     LABS REVIEWED: PREVIOUS:   01-27-17: wbc 6.3; hgb 13.7; hct 41.3; mcv 81.5; plt 187; glucose 104; bun 15; creat 0.89; k+ 4.1; na++ 140; ca 9.5; ast 43; albumin 3.4; ck 822 01-28-17: wbc 4.8; hgb 12.3; hct 38.1; mcv 82.8; plt 175; glucose 93; bun 13; creat 0.80; k+ 3.7; na++ 139; ca 9.1; ck 722  NO NEW LABS   Review of Systems  Constitutional: Negative for malaise/fatigue.  Respiratory: Negative for cough and shortness of breath.   Cardiovascular: Negative for chest pain, palpitations and leg swelling.  Gastrointestinal: Negative for abdominal pain, constipation and heartburn.  Musculoskeletal: Negative for back pain, joint pain and myalgias.       Her right knee pain is managed   Skin: Negative.   Neurological: Negative for dizziness.  Psychiatric/Behavioral: The patient is not nervous/anxious.     Physical Exam  Constitutional: No distress.  HENT:  Has a  stuttering speech  Eyes: Conjunctivae are normal.  Neck: Neck supple. No JVD present. No thyromegaly present.  Cardiovascular: Normal rate and intact distal pulses.   Irregular   Respiratory: Effort normal and breath sounds normal. No respiratory distress. She has no wheezes.  GI: Soft. Bowel sounds are normal. She exhibits no distension. There  is no tenderness.  Musculoskeletal: She exhibits no edema.  Able to move all extremities  Right leg in immobilizer   Lymphadenopathy:    She has no cervical adenopathy.  Neurological: She is alert.  Skin: Skin is warm and dry. She is not diaphoretic.  Psychiatric: She has a normal mood and affect.   ASSESSMENT/ PLAN:   Will discharge her to home with home health for pt/ot/rn: to evaluate and treat as indicated for gait balance strength; adl training and medication management.   She will not need any dme.   The facility knows to setup a follow up appointment with her medical provider within 1-2 weeks post discharge from this facility.  She has had her prescriptions written for a 30 day supply:    allopurinol  100 mg; norvasc 10 mg; eliquis 5 mg; lisinopril 2.5 mg; toprol xl 50 mg; zoloft 25 mg  The narcotic medication: #10  xanax 0.25 mg tabs    The time spent with the discharge process 40 minutes: discussed plan of care; discharge needs; goals of care: family has verbalized understanding.     Ok Edwards NP [facility]  Contact [phone] Monday through Friday 8am- 5pm  After hours call [phone]

## 2017-02-20 DIAGNOSIS — S82151D Displaced fracture of right tibial tuberosity, subsequent encounter for closed fracture with routine healing: Secondary | ICD-10-CM | POA: Diagnosis not present

## 2017-02-20 DIAGNOSIS — H10021 Other mucopurulent conjunctivitis, right eye: Secondary | ICD-10-CM | POA: Diagnosis not present

## 2017-02-20 DIAGNOSIS — I110 Hypertensive heart disease with heart failure: Secondary | ICD-10-CM | POA: Diagnosis not present

## 2017-02-20 DIAGNOSIS — Z9181 History of falling: Secondary | ICD-10-CM | POA: Diagnosis not present

## 2017-02-20 DIAGNOSIS — Z8673 Personal history of transient ischemic attack (TIA), and cerebral infarction without residual deficits: Secondary | ICD-10-CM | POA: Diagnosis not present

## 2017-02-20 DIAGNOSIS — Z853 Personal history of malignant neoplasm of breast: Secondary | ICD-10-CM | POA: Diagnosis not present

## 2017-02-20 DIAGNOSIS — Z7901 Long term (current) use of anticoagulants: Secondary | ICD-10-CM | POA: Diagnosis not present

## 2017-02-20 DIAGNOSIS — I509 Heart failure, unspecified: Secondary | ICD-10-CM | POA: Diagnosis not present

## 2017-02-20 DIAGNOSIS — M109 Gout, unspecified: Secondary | ICD-10-CM | POA: Diagnosis not present

## 2017-02-20 DIAGNOSIS — M810 Age-related osteoporosis without current pathological fracture: Secondary | ICD-10-CM | POA: Diagnosis not present

## 2017-02-20 DIAGNOSIS — I482 Chronic atrial fibrillation: Secondary | ICD-10-CM | POA: Diagnosis not present

## 2017-02-21 DIAGNOSIS — M810 Age-related osteoporosis without current pathological fracture: Secondary | ICD-10-CM | POA: Diagnosis not present

## 2017-02-21 DIAGNOSIS — Z7901 Long term (current) use of anticoagulants: Secondary | ICD-10-CM | POA: Diagnosis not present

## 2017-02-21 DIAGNOSIS — Z9181 History of falling: Secondary | ICD-10-CM | POA: Diagnosis not present

## 2017-02-21 DIAGNOSIS — I509 Heart failure, unspecified: Secondary | ICD-10-CM | POA: Diagnosis not present

## 2017-02-21 DIAGNOSIS — H10021 Other mucopurulent conjunctivitis, right eye: Secondary | ICD-10-CM | POA: Diagnosis not present

## 2017-02-21 DIAGNOSIS — M109 Gout, unspecified: Secondary | ICD-10-CM | POA: Diagnosis not present

## 2017-02-21 DIAGNOSIS — S82151D Displaced fracture of right tibial tuberosity, subsequent encounter for closed fracture with routine healing: Secondary | ICD-10-CM | POA: Diagnosis not present

## 2017-02-21 DIAGNOSIS — Z8673 Personal history of transient ischemic attack (TIA), and cerebral infarction without residual deficits: Secondary | ICD-10-CM | POA: Diagnosis not present

## 2017-02-21 DIAGNOSIS — I482 Chronic atrial fibrillation: Secondary | ICD-10-CM | POA: Diagnosis not present

## 2017-02-21 DIAGNOSIS — I110 Hypertensive heart disease with heart failure: Secondary | ICD-10-CM | POA: Diagnosis not present

## 2017-02-21 DIAGNOSIS — Z853 Personal history of malignant neoplasm of breast: Secondary | ICD-10-CM | POA: Diagnosis not present

## 2017-02-24 DIAGNOSIS — Z8673 Personal history of transient ischemic attack (TIA), and cerebral infarction without residual deficits: Secondary | ICD-10-CM | POA: Diagnosis not present

## 2017-02-24 DIAGNOSIS — I482 Chronic atrial fibrillation: Secondary | ICD-10-CM | POA: Diagnosis not present

## 2017-02-24 DIAGNOSIS — Z853 Personal history of malignant neoplasm of breast: Secondary | ICD-10-CM | POA: Diagnosis not present

## 2017-02-24 DIAGNOSIS — I110 Hypertensive heart disease with heart failure: Secondary | ICD-10-CM | POA: Diagnosis not present

## 2017-02-24 DIAGNOSIS — M810 Age-related osteoporosis without current pathological fracture: Secondary | ICD-10-CM | POA: Diagnosis not present

## 2017-02-24 DIAGNOSIS — Z9181 History of falling: Secondary | ICD-10-CM | POA: Diagnosis not present

## 2017-02-24 DIAGNOSIS — S82151D Displaced fracture of right tibial tuberosity, subsequent encounter for closed fracture with routine healing: Secondary | ICD-10-CM | POA: Diagnosis not present

## 2017-02-24 DIAGNOSIS — I509 Heart failure, unspecified: Secondary | ICD-10-CM | POA: Diagnosis not present

## 2017-02-24 DIAGNOSIS — M109 Gout, unspecified: Secondary | ICD-10-CM | POA: Diagnosis not present

## 2017-02-24 DIAGNOSIS — H10021 Other mucopurulent conjunctivitis, right eye: Secondary | ICD-10-CM | POA: Diagnosis not present

## 2017-02-24 DIAGNOSIS — Z7901 Long term (current) use of anticoagulants: Secondary | ICD-10-CM | POA: Diagnosis not present

## 2017-02-25 ENCOUNTER — Ambulatory Visit (INDEPENDENT_AMBULATORY_CARE_PROVIDER_SITE_OTHER): Payer: Medicare Other | Admitting: Orthopaedic Surgery

## 2017-02-25 DIAGNOSIS — Z8673 Personal history of transient ischemic attack (TIA), and cerebral infarction without residual deficits: Secondary | ICD-10-CM | POA: Diagnosis not present

## 2017-02-25 DIAGNOSIS — Z9181 History of falling: Secondary | ICD-10-CM | POA: Diagnosis not present

## 2017-02-25 DIAGNOSIS — I509 Heart failure, unspecified: Secondary | ICD-10-CM | POA: Diagnosis not present

## 2017-02-25 DIAGNOSIS — Z7901 Long term (current) use of anticoagulants: Secondary | ICD-10-CM | POA: Diagnosis not present

## 2017-02-25 DIAGNOSIS — H10021 Other mucopurulent conjunctivitis, right eye: Secondary | ICD-10-CM | POA: Diagnosis not present

## 2017-02-25 DIAGNOSIS — M810 Age-related osteoporosis without current pathological fracture: Secondary | ICD-10-CM | POA: Diagnosis not present

## 2017-02-25 DIAGNOSIS — I110 Hypertensive heart disease with heart failure: Secondary | ICD-10-CM | POA: Diagnosis not present

## 2017-02-25 DIAGNOSIS — S82151D Displaced fracture of right tibial tuberosity, subsequent encounter for closed fracture with routine healing: Secondary | ICD-10-CM | POA: Diagnosis not present

## 2017-02-25 DIAGNOSIS — I482 Chronic atrial fibrillation: Secondary | ICD-10-CM | POA: Diagnosis not present

## 2017-02-25 DIAGNOSIS — M109 Gout, unspecified: Secondary | ICD-10-CM | POA: Diagnosis not present

## 2017-02-25 DIAGNOSIS — Z853 Personal history of malignant neoplasm of breast: Secondary | ICD-10-CM | POA: Diagnosis not present

## 2017-02-27 DIAGNOSIS — Z853 Personal history of malignant neoplasm of breast: Secondary | ICD-10-CM | POA: Diagnosis not present

## 2017-02-27 DIAGNOSIS — S82151S Displaced fracture of right tibial tuberosity, sequela: Secondary | ICD-10-CM | POA: Insufficient documentation

## 2017-02-27 DIAGNOSIS — I509 Heart failure, unspecified: Secondary | ICD-10-CM | POA: Diagnosis not present

## 2017-02-27 DIAGNOSIS — I110 Hypertensive heart disease with heart failure: Secondary | ICD-10-CM | POA: Diagnosis not present

## 2017-02-27 DIAGNOSIS — S82151D Displaced fracture of right tibial tuberosity, subsequent encounter for closed fracture with routine healing: Secondary | ICD-10-CM | POA: Diagnosis not present

## 2017-02-27 DIAGNOSIS — Z7901 Long term (current) use of anticoagulants: Secondary | ICD-10-CM | POA: Diagnosis not present

## 2017-02-27 DIAGNOSIS — H10021 Other mucopurulent conjunctivitis, right eye: Secondary | ICD-10-CM | POA: Diagnosis not present

## 2017-02-27 DIAGNOSIS — Z8673 Personal history of transient ischemic attack (TIA), and cerebral infarction without residual deficits: Secondary | ICD-10-CM | POA: Diagnosis not present

## 2017-02-27 DIAGNOSIS — Z9181 History of falling: Secondary | ICD-10-CM | POA: Diagnosis not present

## 2017-02-27 DIAGNOSIS — I482 Chronic atrial fibrillation: Secondary | ICD-10-CM | POA: Diagnosis not present

## 2017-02-27 DIAGNOSIS — M810 Age-related osteoporosis without current pathological fracture: Secondary | ICD-10-CM | POA: Diagnosis not present

## 2017-02-27 DIAGNOSIS — M109 Gout, unspecified: Secondary | ICD-10-CM | POA: Diagnosis not present

## 2017-02-28 DIAGNOSIS — I482 Chronic atrial fibrillation: Secondary | ICD-10-CM | POA: Diagnosis not present

## 2017-02-28 DIAGNOSIS — M810 Age-related osteoporosis without current pathological fracture: Secondary | ICD-10-CM | POA: Diagnosis not present

## 2017-02-28 DIAGNOSIS — I110 Hypertensive heart disease with heart failure: Secondary | ICD-10-CM | POA: Diagnosis not present

## 2017-02-28 DIAGNOSIS — Z853 Personal history of malignant neoplasm of breast: Secondary | ICD-10-CM | POA: Diagnosis not present

## 2017-02-28 DIAGNOSIS — Z9181 History of falling: Secondary | ICD-10-CM | POA: Diagnosis not present

## 2017-02-28 DIAGNOSIS — I509 Heart failure, unspecified: Secondary | ICD-10-CM | POA: Diagnosis not present

## 2017-02-28 DIAGNOSIS — M109 Gout, unspecified: Secondary | ICD-10-CM | POA: Diagnosis not present

## 2017-02-28 DIAGNOSIS — S82151D Displaced fracture of right tibial tuberosity, subsequent encounter for closed fracture with routine healing: Secondary | ICD-10-CM | POA: Diagnosis not present

## 2017-02-28 DIAGNOSIS — H10021 Other mucopurulent conjunctivitis, right eye: Secondary | ICD-10-CM | POA: Diagnosis not present

## 2017-02-28 DIAGNOSIS — Z7901 Long term (current) use of anticoagulants: Secondary | ICD-10-CM | POA: Diagnosis not present

## 2017-02-28 DIAGNOSIS — Z8673 Personal history of transient ischemic attack (TIA), and cerebral infarction without residual deficits: Secondary | ICD-10-CM | POA: Diagnosis not present

## 2017-03-03 DIAGNOSIS — E785 Hyperlipidemia, unspecified: Secondary | ICD-10-CM | POA: Diagnosis not present

## 2017-03-03 DIAGNOSIS — I10 Essential (primary) hypertension: Secondary | ICD-10-CM | POA: Diagnosis not present

## 2017-03-05 DIAGNOSIS — Z7901 Long term (current) use of anticoagulants: Secondary | ICD-10-CM | POA: Diagnosis not present

## 2017-03-05 DIAGNOSIS — Z8673 Personal history of transient ischemic attack (TIA), and cerebral infarction without residual deficits: Secondary | ICD-10-CM | POA: Diagnosis not present

## 2017-03-05 DIAGNOSIS — H10021 Other mucopurulent conjunctivitis, right eye: Secondary | ICD-10-CM | POA: Diagnosis not present

## 2017-03-05 DIAGNOSIS — S82151D Displaced fracture of right tibial tuberosity, subsequent encounter for closed fracture with routine healing: Secondary | ICD-10-CM | POA: Diagnosis not present

## 2017-03-05 DIAGNOSIS — M109 Gout, unspecified: Secondary | ICD-10-CM | POA: Diagnosis not present

## 2017-03-05 DIAGNOSIS — Z9181 History of falling: Secondary | ICD-10-CM | POA: Diagnosis not present

## 2017-03-05 DIAGNOSIS — I110 Hypertensive heart disease with heart failure: Secondary | ICD-10-CM | POA: Diagnosis not present

## 2017-03-05 DIAGNOSIS — I482 Chronic atrial fibrillation: Secondary | ICD-10-CM | POA: Diagnosis not present

## 2017-03-05 DIAGNOSIS — Z853 Personal history of malignant neoplasm of breast: Secondary | ICD-10-CM | POA: Diagnosis not present

## 2017-03-05 DIAGNOSIS — I509 Heart failure, unspecified: Secondary | ICD-10-CM | POA: Diagnosis not present

## 2017-03-05 DIAGNOSIS — M810 Age-related osteoporosis without current pathological fracture: Secondary | ICD-10-CM | POA: Diagnosis not present

## 2017-03-06 DIAGNOSIS — I482 Chronic atrial fibrillation: Secondary | ICD-10-CM | POA: Diagnosis not present

## 2017-03-06 DIAGNOSIS — I509 Heart failure, unspecified: Secondary | ICD-10-CM | POA: Diagnosis not present

## 2017-03-06 DIAGNOSIS — Z7901 Long term (current) use of anticoagulants: Secondary | ICD-10-CM | POA: Diagnosis not present

## 2017-03-06 DIAGNOSIS — Z853 Personal history of malignant neoplasm of breast: Secondary | ICD-10-CM | POA: Diagnosis not present

## 2017-03-06 DIAGNOSIS — M810 Age-related osteoporosis without current pathological fracture: Secondary | ICD-10-CM | POA: Diagnosis not present

## 2017-03-06 DIAGNOSIS — M109 Gout, unspecified: Secondary | ICD-10-CM | POA: Diagnosis not present

## 2017-03-06 DIAGNOSIS — I110 Hypertensive heart disease with heart failure: Secondary | ICD-10-CM | POA: Diagnosis not present

## 2017-03-06 DIAGNOSIS — S82151D Displaced fracture of right tibial tuberosity, subsequent encounter for closed fracture with routine healing: Secondary | ICD-10-CM | POA: Diagnosis not present

## 2017-03-06 DIAGNOSIS — H10021 Other mucopurulent conjunctivitis, right eye: Secondary | ICD-10-CM | POA: Diagnosis not present

## 2017-03-06 DIAGNOSIS — Z8673 Personal history of transient ischemic attack (TIA), and cerebral infarction without residual deficits: Secondary | ICD-10-CM | POA: Diagnosis not present

## 2017-03-06 DIAGNOSIS — Z9181 History of falling: Secondary | ICD-10-CM | POA: Diagnosis not present

## 2017-03-07 DIAGNOSIS — Z7901 Long term (current) use of anticoagulants: Secondary | ICD-10-CM | POA: Diagnosis not present

## 2017-03-07 DIAGNOSIS — M810 Age-related osteoporosis without current pathological fracture: Secondary | ICD-10-CM | POA: Diagnosis not present

## 2017-03-07 DIAGNOSIS — I110 Hypertensive heart disease with heart failure: Secondary | ICD-10-CM | POA: Diagnosis not present

## 2017-03-07 DIAGNOSIS — H10021 Other mucopurulent conjunctivitis, right eye: Secondary | ICD-10-CM | POA: Diagnosis not present

## 2017-03-07 DIAGNOSIS — Z9181 History of falling: Secondary | ICD-10-CM | POA: Diagnosis not present

## 2017-03-07 DIAGNOSIS — M109 Gout, unspecified: Secondary | ICD-10-CM | POA: Diagnosis not present

## 2017-03-07 DIAGNOSIS — Z853 Personal history of malignant neoplasm of breast: Secondary | ICD-10-CM | POA: Diagnosis not present

## 2017-03-07 DIAGNOSIS — S82151D Displaced fracture of right tibial tuberosity, subsequent encounter for closed fracture with routine healing: Secondary | ICD-10-CM | POA: Diagnosis not present

## 2017-03-07 DIAGNOSIS — I482 Chronic atrial fibrillation: Secondary | ICD-10-CM | POA: Diagnosis not present

## 2017-03-07 DIAGNOSIS — I509 Heart failure, unspecified: Secondary | ICD-10-CM | POA: Diagnosis not present

## 2017-03-07 DIAGNOSIS — Z8673 Personal history of transient ischemic attack (TIA), and cerebral infarction without residual deficits: Secondary | ICD-10-CM | POA: Diagnosis not present

## 2017-03-11 DIAGNOSIS — I482 Chronic atrial fibrillation: Secondary | ICD-10-CM | POA: Diagnosis not present

## 2017-03-11 DIAGNOSIS — S82151D Displaced fracture of right tibial tuberosity, subsequent encounter for closed fracture with routine healing: Secondary | ICD-10-CM | POA: Diagnosis not present

## 2017-03-11 DIAGNOSIS — Z9181 History of falling: Secondary | ICD-10-CM | POA: Diagnosis not present

## 2017-03-11 DIAGNOSIS — I110 Hypertensive heart disease with heart failure: Secondary | ICD-10-CM | POA: Diagnosis not present

## 2017-03-11 DIAGNOSIS — I509 Heart failure, unspecified: Secondary | ICD-10-CM | POA: Diagnosis not present

## 2017-03-11 DIAGNOSIS — M810 Age-related osteoporosis without current pathological fracture: Secondary | ICD-10-CM | POA: Diagnosis not present

## 2017-03-11 DIAGNOSIS — Z8673 Personal history of transient ischemic attack (TIA), and cerebral infarction without residual deficits: Secondary | ICD-10-CM | POA: Diagnosis not present

## 2017-03-11 DIAGNOSIS — Z7901 Long term (current) use of anticoagulants: Secondary | ICD-10-CM | POA: Diagnosis not present

## 2017-03-11 DIAGNOSIS — H10021 Other mucopurulent conjunctivitis, right eye: Secondary | ICD-10-CM | POA: Diagnosis not present

## 2017-03-11 DIAGNOSIS — M109 Gout, unspecified: Secondary | ICD-10-CM | POA: Diagnosis not present

## 2017-03-11 DIAGNOSIS — Z853 Personal history of malignant neoplasm of breast: Secondary | ICD-10-CM | POA: Diagnosis not present

## 2017-03-12 DIAGNOSIS — Z9181 History of falling: Secondary | ICD-10-CM | POA: Diagnosis not present

## 2017-03-12 DIAGNOSIS — Z853 Personal history of malignant neoplasm of breast: Secondary | ICD-10-CM | POA: Diagnosis not present

## 2017-03-12 DIAGNOSIS — M109 Gout, unspecified: Secondary | ICD-10-CM | POA: Diagnosis not present

## 2017-03-12 DIAGNOSIS — M810 Age-related osteoporosis without current pathological fracture: Secondary | ICD-10-CM | POA: Diagnosis not present

## 2017-03-12 DIAGNOSIS — I509 Heart failure, unspecified: Secondary | ICD-10-CM | POA: Diagnosis not present

## 2017-03-12 DIAGNOSIS — I482 Chronic atrial fibrillation: Secondary | ICD-10-CM | POA: Diagnosis not present

## 2017-03-12 DIAGNOSIS — S82151D Displaced fracture of right tibial tuberosity, subsequent encounter for closed fracture with routine healing: Secondary | ICD-10-CM | POA: Diagnosis not present

## 2017-03-12 DIAGNOSIS — Z7901 Long term (current) use of anticoagulants: Secondary | ICD-10-CM | POA: Diagnosis not present

## 2017-03-12 DIAGNOSIS — H10021 Other mucopurulent conjunctivitis, right eye: Secondary | ICD-10-CM | POA: Diagnosis not present

## 2017-03-12 DIAGNOSIS — I110 Hypertensive heart disease with heart failure: Secondary | ICD-10-CM | POA: Diagnosis not present

## 2017-03-12 DIAGNOSIS — Z8673 Personal history of transient ischemic attack (TIA), and cerebral infarction without residual deficits: Secondary | ICD-10-CM | POA: Diagnosis not present

## 2017-03-13 DIAGNOSIS — I482 Chronic atrial fibrillation: Secondary | ICD-10-CM | POA: Diagnosis not present

## 2017-03-13 DIAGNOSIS — I110 Hypertensive heart disease with heart failure: Secondary | ICD-10-CM | POA: Diagnosis not present

## 2017-03-13 DIAGNOSIS — Z9181 History of falling: Secondary | ICD-10-CM | POA: Diagnosis not present

## 2017-03-13 DIAGNOSIS — Z853 Personal history of malignant neoplasm of breast: Secondary | ICD-10-CM | POA: Diagnosis not present

## 2017-03-13 DIAGNOSIS — M810 Age-related osteoporosis without current pathological fracture: Secondary | ICD-10-CM | POA: Diagnosis not present

## 2017-03-13 DIAGNOSIS — S82151D Displaced fracture of right tibial tuberosity, subsequent encounter for closed fracture with routine healing: Secondary | ICD-10-CM | POA: Diagnosis not present

## 2017-03-13 DIAGNOSIS — M109 Gout, unspecified: Secondary | ICD-10-CM | POA: Diagnosis not present

## 2017-03-13 DIAGNOSIS — Z7901 Long term (current) use of anticoagulants: Secondary | ICD-10-CM | POA: Diagnosis not present

## 2017-03-13 DIAGNOSIS — I509 Heart failure, unspecified: Secondary | ICD-10-CM | POA: Diagnosis not present

## 2017-03-13 DIAGNOSIS — H10021 Other mucopurulent conjunctivitis, right eye: Secondary | ICD-10-CM | POA: Diagnosis not present

## 2017-03-13 DIAGNOSIS — Z8673 Personal history of transient ischemic attack (TIA), and cerebral infarction without residual deficits: Secondary | ICD-10-CM | POA: Diagnosis not present

## 2017-03-14 DIAGNOSIS — I482 Chronic atrial fibrillation: Secondary | ICD-10-CM | POA: Diagnosis not present

## 2017-03-14 DIAGNOSIS — Z853 Personal history of malignant neoplasm of breast: Secondary | ICD-10-CM | POA: Diagnosis not present

## 2017-03-14 DIAGNOSIS — H10021 Other mucopurulent conjunctivitis, right eye: Secondary | ICD-10-CM | POA: Diagnosis not present

## 2017-03-14 DIAGNOSIS — Z8673 Personal history of transient ischemic attack (TIA), and cerebral infarction without residual deficits: Secondary | ICD-10-CM | POA: Diagnosis not present

## 2017-03-14 DIAGNOSIS — Z7901 Long term (current) use of anticoagulants: Secondary | ICD-10-CM | POA: Diagnosis not present

## 2017-03-14 DIAGNOSIS — I110 Hypertensive heart disease with heart failure: Secondary | ICD-10-CM | POA: Diagnosis not present

## 2017-03-14 DIAGNOSIS — S82151D Displaced fracture of right tibial tuberosity, subsequent encounter for closed fracture with routine healing: Secondary | ICD-10-CM | POA: Diagnosis not present

## 2017-03-14 DIAGNOSIS — I509 Heart failure, unspecified: Secondary | ICD-10-CM | POA: Diagnosis not present

## 2017-03-14 DIAGNOSIS — M810 Age-related osteoporosis without current pathological fracture: Secondary | ICD-10-CM | POA: Diagnosis not present

## 2017-03-14 DIAGNOSIS — M109 Gout, unspecified: Secondary | ICD-10-CM | POA: Diagnosis not present

## 2017-03-14 DIAGNOSIS — Z9181 History of falling: Secondary | ICD-10-CM | POA: Diagnosis not present

## 2017-03-17 DIAGNOSIS — H10021 Other mucopurulent conjunctivitis, right eye: Secondary | ICD-10-CM | POA: Diagnosis not present

## 2017-03-17 DIAGNOSIS — Z853 Personal history of malignant neoplasm of breast: Secondary | ICD-10-CM | POA: Diagnosis not present

## 2017-03-17 DIAGNOSIS — I110 Hypertensive heart disease with heart failure: Secondary | ICD-10-CM | POA: Diagnosis not present

## 2017-03-17 DIAGNOSIS — S82151D Displaced fracture of right tibial tuberosity, subsequent encounter for closed fracture with routine healing: Secondary | ICD-10-CM | POA: Diagnosis not present

## 2017-03-17 DIAGNOSIS — M109 Gout, unspecified: Secondary | ICD-10-CM | POA: Diagnosis not present

## 2017-03-17 DIAGNOSIS — Z9181 History of falling: Secondary | ICD-10-CM | POA: Diagnosis not present

## 2017-03-17 DIAGNOSIS — Z7901 Long term (current) use of anticoagulants: Secondary | ICD-10-CM | POA: Diagnosis not present

## 2017-03-17 DIAGNOSIS — M810 Age-related osteoporosis without current pathological fracture: Secondary | ICD-10-CM | POA: Diagnosis not present

## 2017-03-17 DIAGNOSIS — I509 Heart failure, unspecified: Secondary | ICD-10-CM | POA: Diagnosis not present

## 2017-03-17 DIAGNOSIS — I482 Chronic atrial fibrillation: Secondary | ICD-10-CM | POA: Diagnosis not present

## 2017-03-17 DIAGNOSIS — Z8673 Personal history of transient ischemic attack (TIA), and cerebral infarction without residual deficits: Secondary | ICD-10-CM | POA: Diagnosis not present

## 2017-03-18 DIAGNOSIS — Z8673 Personal history of transient ischemic attack (TIA), and cerebral infarction without residual deficits: Secondary | ICD-10-CM | POA: Diagnosis not present

## 2017-03-18 DIAGNOSIS — S82151D Displaced fracture of right tibial tuberosity, subsequent encounter for closed fracture with routine healing: Secondary | ICD-10-CM | POA: Diagnosis not present

## 2017-03-18 DIAGNOSIS — I509 Heart failure, unspecified: Secondary | ICD-10-CM | POA: Diagnosis not present

## 2017-03-18 DIAGNOSIS — M810 Age-related osteoporosis without current pathological fracture: Secondary | ICD-10-CM | POA: Diagnosis not present

## 2017-03-18 DIAGNOSIS — I482 Chronic atrial fibrillation: Secondary | ICD-10-CM | POA: Diagnosis not present

## 2017-03-18 DIAGNOSIS — I110 Hypertensive heart disease with heart failure: Secondary | ICD-10-CM | POA: Diagnosis not present

## 2017-03-18 DIAGNOSIS — Z853 Personal history of malignant neoplasm of breast: Secondary | ICD-10-CM | POA: Diagnosis not present

## 2017-03-18 DIAGNOSIS — M109 Gout, unspecified: Secondary | ICD-10-CM | POA: Diagnosis not present

## 2017-03-18 DIAGNOSIS — Z9181 History of falling: Secondary | ICD-10-CM | POA: Diagnosis not present

## 2017-03-18 DIAGNOSIS — Z7901 Long term (current) use of anticoagulants: Secondary | ICD-10-CM | POA: Diagnosis not present

## 2017-03-18 DIAGNOSIS — H10021 Other mucopurulent conjunctivitis, right eye: Secondary | ICD-10-CM | POA: Diagnosis not present

## 2017-03-20 DIAGNOSIS — S82151D Displaced fracture of right tibial tuberosity, subsequent encounter for closed fracture with routine healing: Secondary | ICD-10-CM | POA: Diagnosis not present

## 2017-03-20 DIAGNOSIS — I110 Hypertensive heart disease with heart failure: Secondary | ICD-10-CM | POA: Diagnosis not present

## 2017-03-20 DIAGNOSIS — Z9181 History of falling: Secondary | ICD-10-CM | POA: Diagnosis not present

## 2017-03-20 DIAGNOSIS — I482 Chronic atrial fibrillation: Secondary | ICD-10-CM | POA: Diagnosis not present

## 2017-03-20 DIAGNOSIS — I509 Heart failure, unspecified: Secondary | ICD-10-CM | POA: Diagnosis not present

## 2017-03-20 DIAGNOSIS — M109 Gout, unspecified: Secondary | ICD-10-CM | POA: Diagnosis not present

## 2017-03-20 DIAGNOSIS — Z853 Personal history of malignant neoplasm of breast: Secondary | ICD-10-CM | POA: Diagnosis not present

## 2017-03-20 DIAGNOSIS — M810 Age-related osteoporosis without current pathological fracture: Secondary | ICD-10-CM | POA: Diagnosis not present

## 2017-03-20 DIAGNOSIS — Z7901 Long term (current) use of anticoagulants: Secondary | ICD-10-CM | POA: Diagnosis not present

## 2017-03-20 DIAGNOSIS — H10021 Other mucopurulent conjunctivitis, right eye: Secondary | ICD-10-CM | POA: Diagnosis not present

## 2017-03-20 DIAGNOSIS — Z8673 Personal history of transient ischemic attack (TIA), and cerebral infarction without residual deficits: Secondary | ICD-10-CM | POA: Diagnosis not present

## 2017-03-24 DIAGNOSIS — I482 Chronic atrial fibrillation: Secondary | ICD-10-CM | POA: Diagnosis not present

## 2017-03-24 DIAGNOSIS — M810 Age-related osteoporosis without current pathological fracture: Secondary | ICD-10-CM | POA: Diagnosis not present

## 2017-03-24 DIAGNOSIS — M109 Gout, unspecified: Secondary | ICD-10-CM | POA: Diagnosis not present

## 2017-03-24 DIAGNOSIS — S82151D Displaced fracture of right tibial tuberosity, subsequent encounter for closed fracture with routine healing: Secondary | ICD-10-CM | POA: Diagnosis not present

## 2017-03-24 DIAGNOSIS — I110 Hypertensive heart disease with heart failure: Secondary | ICD-10-CM | POA: Diagnosis not present

## 2017-03-24 DIAGNOSIS — Z853 Personal history of malignant neoplasm of breast: Secondary | ICD-10-CM | POA: Diagnosis not present

## 2017-03-24 DIAGNOSIS — Z8673 Personal history of transient ischemic attack (TIA), and cerebral infarction without residual deficits: Secondary | ICD-10-CM | POA: Diagnosis not present

## 2017-03-24 DIAGNOSIS — Z7901 Long term (current) use of anticoagulants: Secondary | ICD-10-CM | POA: Diagnosis not present

## 2017-03-24 DIAGNOSIS — H10021 Other mucopurulent conjunctivitis, right eye: Secondary | ICD-10-CM | POA: Diagnosis not present

## 2017-03-24 DIAGNOSIS — I509 Heart failure, unspecified: Secondary | ICD-10-CM | POA: Diagnosis not present

## 2017-03-24 DIAGNOSIS — Z9181 History of falling: Secondary | ICD-10-CM | POA: Diagnosis not present

## 2017-03-27 DIAGNOSIS — M810 Age-related osteoporosis without current pathological fracture: Secondary | ICD-10-CM | POA: Diagnosis not present

## 2017-03-27 DIAGNOSIS — M109 Gout, unspecified: Secondary | ICD-10-CM | POA: Diagnosis not present

## 2017-03-27 DIAGNOSIS — H10021 Other mucopurulent conjunctivitis, right eye: Secondary | ICD-10-CM | POA: Diagnosis not present

## 2017-03-27 DIAGNOSIS — Z853 Personal history of malignant neoplasm of breast: Secondary | ICD-10-CM | POA: Diagnosis not present

## 2017-03-27 DIAGNOSIS — Z9181 History of falling: Secondary | ICD-10-CM | POA: Diagnosis not present

## 2017-03-27 DIAGNOSIS — I482 Chronic atrial fibrillation: Secondary | ICD-10-CM | POA: Diagnosis not present

## 2017-03-27 DIAGNOSIS — Z8673 Personal history of transient ischemic attack (TIA), and cerebral infarction without residual deficits: Secondary | ICD-10-CM | POA: Diagnosis not present

## 2017-03-27 DIAGNOSIS — I509 Heart failure, unspecified: Secondary | ICD-10-CM | POA: Diagnosis not present

## 2017-03-27 DIAGNOSIS — S82151D Displaced fracture of right tibial tuberosity, subsequent encounter for closed fracture with routine healing: Secondary | ICD-10-CM | POA: Diagnosis not present

## 2017-03-27 DIAGNOSIS — I110 Hypertensive heart disease with heart failure: Secondary | ICD-10-CM | POA: Diagnosis not present

## 2017-03-27 DIAGNOSIS — Z7901 Long term (current) use of anticoagulants: Secondary | ICD-10-CM | POA: Diagnosis not present

## 2017-03-31 DIAGNOSIS — I509 Heart failure, unspecified: Secondary | ICD-10-CM | POA: Diagnosis not present

## 2017-03-31 DIAGNOSIS — I110 Hypertensive heart disease with heart failure: Secondary | ICD-10-CM | POA: Diagnosis not present

## 2017-03-31 DIAGNOSIS — M810 Age-related osteoporosis without current pathological fracture: Secondary | ICD-10-CM | POA: Diagnosis not present

## 2017-03-31 DIAGNOSIS — Z853 Personal history of malignant neoplasm of breast: Secondary | ICD-10-CM | POA: Diagnosis not present

## 2017-03-31 DIAGNOSIS — Z8673 Personal history of transient ischemic attack (TIA), and cerebral infarction without residual deficits: Secondary | ICD-10-CM | POA: Diagnosis not present

## 2017-03-31 DIAGNOSIS — M109 Gout, unspecified: Secondary | ICD-10-CM | POA: Diagnosis not present

## 2017-03-31 DIAGNOSIS — Z7901 Long term (current) use of anticoagulants: Secondary | ICD-10-CM | POA: Diagnosis not present

## 2017-03-31 DIAGNOSIS — I482 Chronic atrial fibrillation: Secondary | ICD-10-CM | POA: Diagnosis not present

## 2017-03-31 DIAGNOSIS — H10021 Other mucopurulent conjunctivitis, right eye: Secondary | ICD-10-CM | POA: Diagnosis not present

## 2017-03-31 DIAGNOSIS — Z9181 History of falling: Secondary | ICD-10-CM | POA: Diagnosis not present

## 2017-03-31 DIAGNOSIS — S82151D Displaced fracture of right tibial tuberosity, subsequent encounter for closed fracture with routine healing: Secondary | ICD-10-CM | POA: Diagnosis not present

## 2017-04-02 DIAGNOSIS — M810 Age-related osteoporosis without current pathological fracture: Secondary | ICD-10-CM | POA: Diagnosis not present

## 2017-04-02 DIAGNOSIS — H10021 Other mucopurulent conjunctivitis, right eye: Secondary | ICD-10-CM | POA: Diagnosis not present

## 2017-04-02 DIAGNOSIS — Z9181 History of falling: Secondary | ICD-10-CM | POA: Diagnosis not present

## 2017-04-02 DIAGNOSIS — S82151D Displaced fracture of right tibial tuberosity, subsequent encounter for closed fracture with routine healing: Secondary | ICD-10-CM | POA: Diagnosis not present

## 2017-04-02 DIAGNOSIS — I509 Heart failure, unspecified: Secondary | ICD-10-CM | POA: Diagnosis not present

## 2017-04-02 DIAGNOSIS — M109 Gout, unspecified: Secondary | ICD-10-CM | POA: Diagnosis not present

## 2017-04-02 DIAGNOSIS — I482 Chronic atrial fibrillation: Secondary | ICD-10-CM | POA: Diagnosis not present

## 2017-04-02 DIAGNOSIS — Z853 Personal history of malignant neoplasm of breast: Secondary | ICD-10-CM | POA: Diagnosis not present

## 2017-04-02 DIAGNOSIS — Z8673 Personal history of transient ischemic attack (TIA), and cerebral infarction without residual deficits: Secondary | ICD-10-CM | POA: Diagnosis not present

## 2017-04-02 DIAGNOSIS — Z7901 Long term (current) use of anticoagulants: Secondary | ICD-10-CM | POA: Diagnosis not present

## 2017-04-02 DIAGNOSIS — I110 Hypertensive heart disease with heart failure: Secondary | ICD-10-CM | POA: Diagnosis not present

## 2017-04-07 DIAGNOSIS — M810 Age-related osteoporosis without current pathological fracture: Secondary | ICD-10-CM | POA: Diagnosis not present

## 2017-04-07 DIAGNOSIS — H10021 Other mucopurulent conjunctivitis, right eye: Secondary | ICD-10-CM | POA: Diagnosis not present

## 2017-04-07 DIAGNOSIS — S82151D Displaced fracture of right tibial tuberosity, subsequent encounter for closed fracture with routine healing: Secondary | ICD-10-CM | POA: Diagnosis not present

## 2017-04-07 DIAGNOSIS — Z7901 Long term (current) use of anticoagulants: Secondary | ICD-10-CM | POA: Diagnosis not present

## 2017-04-07 DIAGNOSIS — I110 Hypertensive heart disease with heart failure: Secondary | ICD-10-CM | POA: Diagnosis not present

## 2017-04-07 DIAGNOSIS — I482 Chronic atrial fibrillation: Secondary | ICD-10-CM | POA: Diagnosis not present

## 2017-04-07 DIAGNOSIS — M109 Gout, unspecified: Secondary | ICD-10-CM | POA: Diagnosis not present

## 2017-04-07 DIAGNOSIS — Z8673 Personal history of transient ischemic attack (TIA), and cerebral infarction without residual deficits: Secondary | ICD-10-CM | POA: Diagnosis not present

## 2017-04-07 DIAGNOSIS — I509 Heart failure, unspecified: Secondary | ICD-10-CM | POA: Diagnosis not present

## 2017-04-07 DIAGNOSIS — Z853 Personal history of malignant neoplasm of breast: Secondary | ICD-10-CM | POA: Diagnosis not present

## 2017-04-07 DIAGNOSIS — Z9181 History of falling: Secondary | ICD-10-CM | POA: Diagnosis not present

## 2017-04-09 DIAGNOSIS — Z7901 Long term (current) use of anticoagulants: Secondary | ICD-10-CM | POA: Diagnosis not present

## 2017-04-09 DIAGNOSIS — H10021 Other mucopurulent conjunctivitis, right eye: Secondary | ICD-10-CM | POA: Diagnosis not present

## 2017-04-09 DIAGNOSIS — Z853 Personal history of malignant neoplasm of breast: Secondary | ICD-10-CM | POA: Diagnosis not present

## 2017-04-09 DIAGNOSIS — Z8673 Personal history of transient ischemic attack (TIA), and cerebral infarction without residual deficits: Secondary | ICD-10-CM | POA: Diagnosis not present

## 2017-04-09 DIAGNOSIS — I110 Hypertensive heart disease with heart failure: Secondary | ICD-10-CM | POA: Diagnosis not present

## 2017-04-09 DIAGNOSIS — M109 Gout, unspecified: Secondary | ICD-10-CM | POA: Diagnosis not present

## 2017-04-09 DIAGNOSIS — I509 Heart failure, unspecified: Secondary | ICD-10-CM | POA: Diagnosis not present

## 2017-04-09 DIAGNOSIS — M810 Age-related osteoporosis without current pathological fracture: Secondary | ICD-10-CM | POA: Diagnosis not present

## 2017-04-09 DIAGNOSIS — I482 Chronic atrial fibrillation: Secondary | ICD-10-CM | POA: Diagnosis not present

## 2017-04-09 DIAGNOSIS — S82151D Displaced fracture of right tibial tuberosity, subsequent encounter for closed fracture with routine healing: Secondary | ICD-10-CM | POA: Diagnosis not present

## 2017-04-09 DIAGNOSIS — Z9181 History of falling: Secondary | ICD-10-CM | POA: Diagnosis not present

## 2017-04-14 DIAGNOSIS — Z7901 Long term (current) use of anticoagulants: Secondary | ICD-10-CM | POA: Diagnosis not present

## 2017-04-14 DIAGNOSIS — I482 Chronic atrial fibrillation: Secondary | ICD-10-CM | POA: Diagnosis not present

## 2017-04-14 DIAGNOSIS — Z9181 History of falling: Secondary | ICD-10-CM | POA: Diagnosis not present

## 2017-04-14 DIAGNOSIS — S82151D Displaced fracture of right tibial tuberosity, subsequent encounter for closed fracture with routine healing: Secondary | ICD-10-CM | POA: Diagnosis not present

## 2017-04-14 DIAGNOSIS — Z853 Personal history of malignant neoplasm of breast: Secondary | ICD-10-CM | POA: Diagnosis not present

## 2017-04-14 DIAGNOSIS — I110 Hypertensive heart disease with heart failure: Secondary | ICD-10-CM | POA: Diagnosis not present

## 2017-04-14 DIAGNOSIS — H10021 Other mucopurulent conjunctivitis, right eye: Secondary | ICD-10-CM | POA: Diagnosis not present

## 2017-04-14 DIAGNOSIS — M810 Age-related osteoporosis without current pathological fracture: Secondary | ICD-10-CM | POA: Diagnosis not present

## 2017-04-14 DIAGNOSIS — Z8673 Personal history of transient ischemic attack (TIA), and cerebral infarction without residual deficits: Secondary | ICD-10-CM | POA: Diagnosis not present

## 2017-04-14 DIAGNOSIS — M109 Gout, unspecified: Secondary | ICD-10-CM | POA: Diagnosis not present

## 2017-04-14 DIAGNOSIS — I509 Heart failure, unspecified: Secondary | ICD-10-CM | POA: Diagnosis not present

## 2017-04-16 DIAGNOSIS — H10021 Other mucopurulent conjunctivitis, right eye: Secondary | ICD-10-CM | POA: Diagnosis not present

## 2017-04-16 DIAGNOSIS — I110 Hypertensive heart disease with heart failure: Secondary | ICD-10-CM | POA: Diagnosis not present

## 2017-04-16 DIAGNOSIS — I509 Heart failure, unspecified: Secondary | ICD-10-CM | POA: Diagnosis not present

## 2017-04-16 DIAGNOSIS — M810 Age-related osteoporosis without current pathological fracture: Secondary | ICD-10-CM | POA: Diagnosis not present

## 2017-04-16 DIAGNOSIS — Z7901 Long term (current) use of anticoagulants: Secondary | ICD-10-CM | POA: Diagnosis not present

## 2017-04-16 DIAGNOSIS — Z9181 History of falling: Secondary | ICD-10-CM | POA: Diagnosis not present

## 2017-04-16 DIAGNOSIS — I482 Chronic atrial fibrillation: Secondary | ICD-10-CM | POA: Diagnosis not present

## 2017-04-16 DIAGNOSIS — S82151D Displaced fracture of right tibial tuberosity, subsequent encounter for closed fracture with routine healing: Secondary | ICD-10-CM | POA: Diagnosis not present

## 2017-04-16 DIAGNOSIS — M109 Gout, unspecified: Secondary | ICD-10-CM | POA: Diagnosis not present

## 2017-04-16 DIAGNOSIS — Z8673 Personal history of transient ischemic attack (TIA), and cerebral infarction without residual deficits: Secondary | ICD-10-CM | POA: Diagnosis not present

## 2017-04-16 DIAGNOSIS — Z853 Personal history of malignant neoplasm of breast: Secondary | ICD-10-CM | POA: Diagnosis not present

## 2018-10-02 ENCOUNTER — Emergency Department (HOSPITAL_COMMUNITY): Payer: Medicare Other

## 2018-10-02 ENCOUNTER — Emergency Department (HOSPITAL_COMMUNITY)
Admission: EM | Admit: 2018-10-02 | Discharge: 2018-10-07 | Disposition: E | Payer: Medicare Other | Attending: Emergency Medicine | Admitting: Emergency Medicine

## 2018-10-02 DIAGNOSIS — J939 Pneumothorax, unspecified: Secondary | ICD-10-CM | POA: Insufficient documentation

## 2018-10-02 DIAGNOSIS — I469 Cardiac arrest, cause unspecified: Secondary | ICD-10-CM | POA: Insufficient documentation

## 2018-10-02 DIAGNOSIS — G253 Myoclonus: Secondary | ICD-10-CM | POA: Diagnosis not present

## 2018-10-02 DIAGNOSIS — Z515 Encounter for palliative care: Secondary | ICD-10-CM

## 2018-10-02 LAB — TROPONIN I
Troponin I: 0.28 ng/mL
Troponin I: 0.28 ng/mL (ref <0.03)

## 2018-10-02 LAB — CBC
HCT: 42.6 % (ref 36.0–46.0)
Hemoglobin: 12.5 g/dL (ref 12.0–15.0)
MCH: 26.5 pg (ref 26.0–34.0)
MCHC: 29.3 g/dL — ABNORMAL LOW (ref 30.0–36.0)
MCV: 90.4 fL (ref 80.0–100.0)
Platelets: 142 10*3/uL — ABNORMAL LOW (ref 150–400)
RBC: 4.71 MIL/uL (ref 3.87–5.11)
RDW: 14.1 % (ref 11.5–15.5)
WBC: 10.9 10*3/uL — ABNORMAL HIGH (ref 4.0–10.5)
nRBC: 0.2 % (ref 0.0–0.2)

## 2018-10-02 LAB — BASIC METABOLIC PANEL
Anion gap: 17 — ABNORMAL HIGH (ref 5–15)
BUN: 19 mg/dL (ref 8–23)
CHLORIDE: 107 mmol/L (ref 98–111)
CO2: 17 mmol/L — ABNORMAL LOW (ref 22–32)
Calcium: 9.1 mg/dL (ref 8.9–10.3)
Creatinine, Ser: 1.1 mg/dL — ABNORMAL HIGH (ref 0.44–1.00)
GFR calc Af Amer: 52 mL/min — ABNORMAL LOW (ref >60)
GFR calc non Af Amer: 44 mL/min — ABNORMAL LOW (ref >60)
Glucose, Bld: 203 mg/dL — ABNORMAL HIGH (ref 70–99)
Potassium: 4.2 mmol/L (ref 3.5–5.1)
Sodium: 141 mmol/L (ref 135–145)

## 2018-10-02 LAB — MAGNESIUM: Magnesium: 2.4 mg/dL (ref 1.7–2.4)

## 2018-10-02 LAB — HEPATIC FUNCTION PANEL
ALT: 103 U/L — ABNORMAL HIGH (ref 0–44)
AST: 171 U/L — ABNORMAL HIGH (ref 15–41)
Albumin: 2.9 g/dL — ABNORMAL LOW (ref 3.5–5.0)
Alkaline Phosphatase: 83 U/L (ref 38–126)
Bilirubin, Direct: 0.3 mg/dL — ABNORMAL HIGH (ref 0.0–0.2)
Indirect Bilirubin: 0.9 mg/dL (ref 0.3–0.9)
Total Bilirubin: 1.2 mg/dL (ref 0.3–1.2)
Total Protein: 6.1 g/dL — ABNORMAL LOW (ref 6.5–8.1)

## 2018-10-02 LAB — CBG MONITORING, ED: Glucose-Capillary: 201 mg/dL — ABNORMAL HIGH (ref 70–99)

## 2018-10-02 MED ORDER — LORAZEPAM 1 MG PO TABS: 1.0000 mg | ORAL_TABLET | ORAL | Status: DC | PRN

## 2018-10-02 MED ORDER — PROPOFOL 1000 MG/100ML IV EMUL: 5.0000 ug/kg/min | INTRAVENOUS | Status: DC

## 2018-10-02 MED ORDER — LORAZEPAM 2 MG/ML IJ SOLN: 1.0000 mg | INTRAMUSCULAR | Status: DC | PRN

## 2018-10-02 MED ORDER — MORPHINE SULFATE (PF) 4 MG/ML IV SOLN
4.0000 mg | INTRAVENOUS | Status: DC | PRN
  Administered 2018-10-02 (×2): 4 mg via INTRAVENOUS
  Filled 2018-10-02 (×2): qty 1

## 2018-10-02 MED ORDER — NOREPINEPHRINE 4 MG/250ML-% IV SOLN
0.0000 ug/min | INTRAVENOUS | Status: DC
  Filled 2018-10-02: qty 250

## 2018-10-02 MED ORDER — MORPHINE SULFATE (PF) 4 MG/ML IV SOLN
4.0000 mg | Freq: Once | INTRAVENOUS | Status: AC
  Administered 2018-10-02: 4 mg via INTRAVENOUS
  Filled 2018-10-02: qty 1

## 2018-10-02 MED ORDER — HALOPERIDOL 1 MG PO TABS: 0.5000 mg | ORAL_TABLET | ORAL | Status: DC | PRN

## 2018-10-02 MED ORDER — ONDANSETRON 4 MG PO TBDP: 4.0000 mg | ORAL_TABLET | Freq: Four times a day (QID) | ORAL | Status: DC | PRN

## 2018-10-02 MED ORDER — GLYCOPYRROLATE 1 MG PO TABS
1.0000 mg | ORAL_TABLET | ORAL | Status: DC | PRN
  Filled 2018-10-02: qty 1

## 2018-10-02 MED ORDER — POLYVINYL ALCOHOL 1.4 % OP SOLN
1.0000 [drp] | Freq: Four times a day (QID) | OPHTHALMIC | Status: DC | PRN
  Filled 2018-10-02: qty 15

## 2018-10-02 MED ORDER — MORPHINE BOLUS VIA INFUSION
4.0000 mg | INTRAVENOUS | Status: DC | PRN
  Administered 2018-10-02 (×3): 4 mg via INTRAVENOUS
  Filled 2018-10-02: qty 4

## 2018-10-02 MED ORDER — GLYCOPYRROLATE 0.2 MG/ML IJ SOLN: 0.2000 mg | INTRAMUSCULAR | Status: DC | PRN

## 2018-10-02 MED ORDER — GLYCOPYRROLATE 0.2 MG/ML IJ SOLN
0.4000 mg | Freq: Three times a day (TID) | INTRAMUSCULAR | Status: DC
  Administered 2018-10-02: 0.4 mg via INTRAVENOUS
  Filled 2018-10-02: qty 2

## 2018-10-02 MED ORDER — EPINEPHRINE PF 1 MG/ML IJ SOLN
0.5000 ug/min | INTRAVENOUS | Status: DC
  Administered 2018-10-02: 20 ug/min via INTRAVENOUS
  Filled 2018-10-02: qty 4

## 2018-10-02 MED ORDER — AMIODARONE HCL IN DEXTROSE 360-4.14 MG/200ML-% IV SOLN: 60.0000 mg/h | INTRAVENOUS | Status: DC

## 2018-10-02 MED ORDER — MORPHINE SULFATE (PF) 2 MG/ML IV SOLN
1.0000 mg | INTRAVENOUS | Status: DC | PRN
  Administered 2018-10-02: 1 mg via INTRAVENOUS
  Filled 2018-10-02: qty 1

## 2018-10-02 MED ORDER — MORPHINE 100MG IN NS 100ML (1MG/ML) PREMIX INFUSION
10.0000 mg/h | INTRAVENOUS | Status: DC
  Administered 2018-10-02: 10 mg/h via INTRAVENOUS
  Filled 2018-10-02: qty 100

## 2018-10-02 MED ORDER — ONDANSETRON HCL 4 MG/2ML IJ SOLN: 4.0000 mg | Freq: Four times a day (QID) | INTRAMUSCULAR | Status: DC | PRN

## 2018-10-02 MED ORDER — BIOTENE DRY MOUTH MT LIQD: 15.0000 mL | OROMUCOSAL | Status: DC | PRN

## 2018-10-02 MED ORDER — MAGNESIUM SULFATE 2 GM/50ML IV SOLN
2.0000 g | Freq: Once | INTRAVENOUS | Status: AC
  Administered 2018-10-02: 2 g via INTRAVENOUS
  Filled 2018-10-02: qty 50

## 2018-10-02 MED ORDER — HALOPERIDOL LACTATE 2 MG/ML PO CONC
0.5000 mg | ORAL | Status: DC | PRN
  Administered 2018-10-02: 0.5 mg via SUBLINGUAL
  Filled 2018-10-02: qty 0.3

## 2018-10-02 MED ORDER — AMIODARONE HCL IN DEXTROSE 360-4.14 MG/200ML-% IV SOLN
30.0000 mg/h | INTRAVENOUS | Status: DC
  Filled 2018-10-02: qty 200

## 2018-10-02 MED ORDER — LORAZEPAM 2 MG/ML PO CONC: 1.0000 mg | ORAL | Status: DC | PRN

## 2018-10-02 MED ORDER — PROPOFOL 1000 MG/100ML IV EMUL
INTRAVENOUS | Status: AC
  Filled 2018-10-02: qty 100

## 2018-10-02 MED ORDER — EPINEPHRINE PF 1 MG/ML IJ SOLN
0.5000 ug/min | INTRAVENOUS | Status: DC
  Administered 2018-10-02: 20 ug/min via INTRAVENOUS

## 2018-10-02 MED ORDER — LORAZEPAM 2 MG/ML IJ SOLN
1.0000 mg | Freq: Two times a day (BID) | INTRAMUSCULAR | Status: DC
  Administered 2018-10-02: 1 mg via INTRAVENOUS
  Filled 2018-10-02: qty 1

## 2018-10-02 MED ORDER — HALOPERIDOL LACTATE 5 MG/ML IJ SOLN: 0.5000 mg | INTRAMUSCULAR | Status: DC | PRN

## 2018-10-07 NOTE — ED Notes (Signed)
EDP at bedside giving verbal to stop all drips, pt transitioned to comfort care.

## 2018-10-07 NOTE — ED Notes (Signed)
ED Provider at bedside. 

## 2018-10-07 NOTE — ED Notes (Signed)
Decreased oxygen to 2L per palliative.

## 2018-10-07 NOTE — ED Triage Notes (Addendum)
Per EMS: pt from home with son, pt woke up this AM feeling dizzy, had a syncopal episode.  Family called 49, pt pulseless, family started CPR.  Total downtime approx 20 minutes.  Upon EMS arrival pt found to be in ROSC.  EMS began pacing patient at 72.    EMS administered PTA: 4 EPI 300 MG Amiodarone  750 NS EPI infusion 100mcg/min

## 2018-10-07 NOTE — ED Notes (Signed)
Oxygen removed from pt

## 2018-10-07 NOTE — ED Provider Notes (Signed)
[facility] EMERGENCY DEPARTMENT Provider Note   CSN: [id number] Arrival date & time: 10/14/2018  7026    History   Chief Complaint Chief Complaint  Patient presents with   Cardiac Arrest    HPI [name] is a 85 y.o. female.     Level 5 caveat due to intubation, patient is status post cardiac arrest.  Return of spontaneous circulation was achieved after 20 to 30 minutes of CPR.  Patient was defibrillated 4 times.  Patient was given amiodarone.  Patient with no responsiveness with EMS after intubation and return of spontaneous circulation.  Patient is a GCS of 3T.  The history is provided by the EMS personnel.  Cardiac Arrest  Witnessed by:  Healthcare provider Incident location:  Home Time since incident:  1 hour Time before ALS initiated:  Immediate Condition upon EMS arrival:  Unresponsive Pulse:  Present Initial cardiac rhythm per EMS:  Ventricular fibrillation Treatments prior to arrival:  ACLS protocol Medications given prior to ED:  Amiodarone Airway:  Intubation prior to arrival Rhythm on admission to ED:  Sinus bradycardia   Past Medical History:  Diagnosis Date   Anginal pain ([place])    Arthritis    "all over"   Atrial fibrillation, chronic ([place])    Cancer of right breast ([place])    CHF (congestive heart failure) ([place])    Coronary artery disease    CVA (cerebral vascular accident) ([place])    "I've had 2; legs weak since" (01/28/2017)   Gout    "I take RX for it" (01/28/2017)   Hypertension    Migraine    "when I was young"    Patient Active Problem List   Diagnosis Date Noted   Cardiac arrest [facility])    Pneumothorax    Palliative care encounter    Myoclonus    Displaced fracture of right tibial tuberosity, sequela 02/27/2017   History of CVA with residual deficit 01/30/2017   Primary osteoarthritis involving multiple joints 01/30/2017   Anxiety disorder 01/29/2017   Chronic gout of multiple sites 01/29/2017    Right leg pain 01/27/2017   Gait instability 01/27/2017   Acute right hip pain    Inability to bear weight    TIA (transient ischemic attack) 05/14/2014   Essential hypertension 05/14/2014   Aphasia 05/14/2014   AF (paroxysmal atrial fibrillation) ([place]) 05/14/2014   Congestive heart failure of unknown etiology ([place]) 05/14/2014    Past Surgical History:  Procedure Laterality Date   APPENDECTOMY     BREAST BIOPSY Right    BREAST LUMPECTOMY Left    "benign"   CATARACT EXTRACTION W/ INTRAOCULAR LENS  IMPLANT, BILATERAL Bilateral    CHOLECYSTECTOMY     CORONARY ANGIOPLASTY WITH STENT PLACEMENT     HEMORROIDECTOMY     MASTECTOMY Right    TONSILLECTOMY     TUBAL LIGATION     VAGINAL HYSTERECTOMY       OB History   No obstetric history on file.      Home Medications    Prior to Admission medications   Medication Sig Start Date End Date Taking? Authorizing Provider  acetaminophen (TYLENOL) 325 MG tablet Take 650 mg by mouth 3 (three) times daily.    Yes [provider]  allopurinol (ZYLOPRIM) 100 MG tablet Take 100 mg by mouth daily.   Yes [provider]  ALPRAZolam (XANAX) 0.25 MG tablet Take 0.25 mg by mouth 2 (two) times daily as needed for anxiety. Give 0.5 tablet by  mouth every 12 hours as needed for anxiety   Yes [provider]  amLODipine (NORVASC) 10 MG tablet Take 5 mg by mouth daily.    Yes [provider]  apixaban (ELIQUIS) 5 MG TABS tablet Take 1 tablet (5 mg total) by mouth 2 (two) times daily. 05/16/14  Yes Thurnell Lose, MD  cholecalciferol (VITAMIN D) 1000 UNITS tablet Take 1,000 Units by mouth 2 (two) times daily.   Yes [provider]  ketotifen (ALAWAY) 0.025 % ophthalmic solution Place 1 drop into both eyes 2 (two) times daily.   Yes [provider]  lisinopril (PRINIVIL,ZESTRIL) 2.5 MG tablet Take 2.5 mg by mouth daily.   Yes [provider]  metoprolol succinate  (TOPROL-XL) 25 MG 24 hr tablet Take 25 mg by mouth daily.   Yes [provider]  Misc Natural Products (RED WINE EXTRACT) CAPS Take 1 capsule by mouth 2 (two) times daily.   Yes [provider]  Omega-3 Fatty Acids (FISH OIL) 1000 MG CAPS Take 1,000 mg by mouth 2 (two) times daily.   Yes [provider]  pravastatin (PRAVACHOL) 20 MG tablet Take 20 mg by mouth daily. 09/24/18  Yes [provider]    Family History No family history on file.  Social History Social History   Tobacco Use   Smoking status: Never Smoker   Smokeless tobacco: Never Used  Substance Use Topics   Alcohol use: No   Drug use: No     Allergies   Codeine; Penicillins; and Sulfa antibiotics   Review of Systems Review of Systems  Unable to perform ROS: Intubated     Physical Exam Updated Vital Signs  ED Triage Vitals  Enc Vitals Group     BP 17-Oct-2018 0928 (!) 142/61     Pulse Rate October 17, 2018 0928 (!) 58     Resp 2018-10-17 0930 19     Temp --      Temp src --      SpO2 10-17-2018 0928 90 %     Weight --      Height 10/17/18 1005 5' (1.524 m)     Head Circumference --      Peak Flow --      Pain Score --      Pain Loc --      Pain Edu? --      Excl. in [place]? --     Physical Exam Constitutional:      General: She is in acute distress.     Appearance: She is ill-appearing.     Interventions: She is intubated.  HENT:     Head: Normocephalic and atraumatic.     Nose: Nose normal.     Mouth/Throat:     Mouth: Mucous membranes are moist.  Eyes:     Pupils: Pupils are equal.     Right eye: Pupil is not reactive.     Left eye: Pupil is not reactive.  Neck:     Musculoskeletal: Neck supple.  Cardiovascular:     Rate and Rhythm: Regular rhythm. Bradycardia present.     Pulses:          Femoral pulses are 2+ on the right side and 2+ on the left side. Pulmonary:     Effort: She is intubated.     Breath sounds: Decreased breath sounds present.  Abdominal:      General: There is no distension.  Musculoskeletal:     Right lower leg: No edema.  Left lower leg: No edema.  Skin:    General: Skin is warm.     Capillary Refill: Capillary refill takes less than 2 seconds.  Neurological:     Mental Status: She is unresponsive.     GCS: GCS eye subscore is 1. GCS verbal subscore is 1. GCS motor subscore is 1.      ED Treatments / Results  Labs (all labs ordered are listed, but only abnormal results are displayed) Labs Reviewed  BASIC METABOLIC PANEL - Abnormal; Notable for the following components:      Result Value   CO2 17 (*)    Glucose, Bld 203 (*)    Creatinine, Ser 1.10 (*)    GFR calc non Af Amer 44 (*)    GFR calc Af Amer 52 (*)    Anion gap 17 (*)    All other components within normal limits  TROPONIN I - Abnormal; Notable for the following components:   Troponin I 0.28 (*)    All other components within normal limits  TROPONIN I - Abnormal; Notable for the following components:   Troponin I 0.28 (*)    All other components within normal limits  CBC - Abnormal; Notable for the following components:   WBC 10.9 (*)    MCHC 29.3 (*)    Platelets 142 (*)    All other components within normal limits  HEPATIC FUNCTION PANEL - Abnormal; Notable for the following components:   Total Protein 6.1 (*)    Albumin 2.9 (*)    AST 171 (*)    ALT 103 (*)    Bilirubin, Direct 0.3 (*)    All other components within normal limits  CBG MONITORING, ED - Abnormal; Notable for the following components:   Glucose-Capillary 201 (*)    All other components within normal limits  MAGNESIUM  CBG MONITORING, ED    EKG EKG Interpretation  Date/Time:  Oct 17, 2018 09:30:43 EDT Ventricular Rate:  57 PR Interval:    QRS Duration: 103 QT Interval:  452 QTC Calculation: 441 R Axis:   -22 Text Interpretation:  Sinus or ectopic atrial rhythm Atrial premature complexes Prolonged PR interval Probable inferior infarct, age indeterminate  Lateral leads are also involved Confirmed by Lennice Sites [phone]) on 2018-10-17 9:55:33 AM   Radiology Dg Chest Portable 1 View  Result Date: 10/17/2018 CLINICAL DATA:  Status post intubation today. EXAM: PORTABLE CHEST 1 VIEW COMPARISON:  Single view of the chest 12/14/2012. FINDINGS: Endotracheal tube is in place with the tip 2.2 cm above the carina. NG tube courses into the stomach and below the inferior margin the film. The patient has a large right pneumothorax with a shift of the mediastinal contents to the left. Left basilar atelectasis noted. Heart size is enlarged. Aortic atherosclerosis is noted. IMPRESSION: Large right pneumothorax appears to be under tension. ETT tip is 2.2 cm above the carina. Cardiomegaly. Atherosclerosis. Critical Value/emergent results were called by telephone at the time of interpretation on 10-17-18 at 10:33 am to Dr. Lennice Sites , who verbally acknowledged these results. Electronically Signed   By: Inge Rise M.D.   On: 10-17-18 10:35    Procedures .Critical Care Performed by: Lennice Sites, DO Authorized by: Lennice Sites, DO   Critical care provider statement:    Critical care time (minutes):  50   Critical care was necessary to treat or prevent imminent or life-threatening deterioration of the following conditions:  Cardiac failure and respiratory failure   Critical  care was time spent personally by me on the following activities:  Blood draw for specimens, development of treatment plan with patient or surrogate, discussions with consultants, evaluation of patient's response to treatment, examination of patient, obtaining history from patient or surrogate, ordering and performing treatments and interventions, ordering and review of laboratory studies, ordering and review of radiographic studies, pulse oximetry, re-evaluation of patient's condition and review of old charts   I assumed direction of critical care for this patient from another  provider in my specialty: no     (including critical care time)  Medications Ordered in ED Medications  morphine 4 MG/ML injection 4 mg (4 mg Intravenous Given 2018/10/21 1126)  haloperidol (HALDOL) tablet 0.5 mg ( Oral See Alternative Oct 21, 2018 1142)    Or  haloperidol (HALDOL) 2 MG/ML solution 0.5 mg (0.5 mg Sublingual Given 10-21-2018 1142)    Or  haloperidol lactate (HALDOL) injection 0.5 mg ( Intravenous See Alternative 2018-10-21 1142)  ondansetron (ZOFRAN-ODT) disintegrating tablet 4 mg (has no administration in time range)    Or  ondansetron (ZOFRAN) injection 4 mg (has no administration in time range)  glycopyrrolate (ROBINUL) tablet 1 mg (has no administration in time range)    Or  glycopyrrolate (ROBINUL) injection 0.2 mg (has no administration in time range)    Or  glycopyrrolate (ROBINUL) injection 0.2 mg (has no administration in time range)  antiseptic oral rinse (BIOTENE) solution 15 mL (has no administration in time range)  polyvinyl alcohol (LIQUIFILM TEARS) 1.4 % ophthalmic solution 1 drop (has no administration in time range)  morphine bolus via infusion 4 mg (4 mg Intravenous Bolus from Bag 2018/10/21 1243)  morphine 100mg  in NS 161mL (1mg /mL) infusion - premix (0 mg/hr Intravenous Stopped 2018/10/21 1250)  LORazepam (ATIVAN) injection 1 mg (1 mg Intravenous Given 2018/10/21 1130)  glycopyrrolate (ROBINUL) injection 0.4 mg (0.4 mg Intravenous Given 21-Oct-2018 1153)  LORazepam (ATIVAN) injection 1 mg (has no administration in time range)  magnesium sulfate IVPB 2 g 50 mL (0 g Intravenous Stopped Oct 21, 2018 1012)  morphine 4 MG/ML injection 4 mg (4 mg Intravenous Given 2018/10/21 1042)     Initial Impression / Assessment and Plan / ED Course  I have reviewed the triage vital signs and the nursing notes.  Pertinent labs & imaging results that were available during my care of the patient were reviewed by me and considered in my medical decision making (see chart for details).     [name] is an 85 year old female with history of CAD, heart failure, stroke who presents to the ED after cardiac arrest in the field.  Patient woke up felt lightheaded and syncopized.  She had no pulse upon arrival by fire department.  They defibrillated the patient twice with no return of spontaneous circulation.  EMS arrived and got return of spontaneous circulation after amiodarone and 2 defibrillations.  Patient was intubated.  Total CPR time was about 20 to 30 minutes.  CPR was initiated likely several minutes after patient lost pulses.  She arrives to the ED with a GCS of 3T.  Patient does not appear to be breathing on her own.  EKG shows sinus bradycardia.  Discussed patient with cardiology.  He recommends no need for cath activation at this time.  No ischemic changes at this time.  No heart block.  Patient with no pupillary light reaction.  Overall suspect severe anoxic brain injury.  Once patient was switched over to the ventilator, she became hypotensive and epinephrine infusion was  started.  Patient had diminished breath sounds.  Chest x-ray showed pneumothorax.  This is likely from CPR.  At this time, I discussed at length with family about making her comfort care.  The patient's son stated that patient did wish to be a DNR but when fire department was at the scene he did not let them know or he just hesitated.  At this time after discussion he would like the patient to be made comfort care and I believe that this is the right decision for the patient.  It appears consistent with her prior wishes.  Palliative care was consulted as well and they came down to the ED and confirmed this with family as well.  Patient was started on a morphine infusion and transition to full comfort care.  Social work is involved and will contact hospice facility but will observe the patient to see if she passes while in the ED.  I have talked with her primary care doctor Dr. Karlton Lemon to make her aware.  I was called to the  bedside by nursing staff when patient lost pulses.  EKG shows asystole on the monitor, patient did not have any pulses.  Patient was pronounced dead at 1249.  Death certificate to be faxed over to primary care doctor, Dr. Karlton Lemon.  Medical examiner was not called as likely with that secondary to cardiac arrest from CAD.  Also suffered traumatic pneumothorax from CPR.  This chart was dictated using voice recognition software.  Despite best efforts to proofread,  errors can occur which can change the documentation meaning.    Final Clinical Impressions(s) / ED Diagnoses   Final diagnoses:  Cardiac arrest [facility])  Pneumothorax, unspecified type    ED Discharge Orders    None       Lennice Sites, DO 10-16-18 [id number]

## 2018-10-07 NOTE — ED Notes (Signed)
Family and pastor at bedside. 

## 2018-10-07 NOTE — ED Notes (Addendum)
Paged Primary care Doctor Per Dr. Ronnald Nian

## 2018-10-07 NOTE — Consult Note (Signed)
Consultation Note Date: 10-23-2018   Patient Name: [name]  DOB: 04-17-1927  MRN: [id number]  Age / Sex: 85 y.o., female  PCP: Willey Blade, MD Referring Physician: Lennice Sites, DO  Reason for Consultation: Non pain symptom management, Pain control, Psychosocial/spiritual support and Terminal Care  HPI/Patient Profile: 85 y.o. female  with past medical history of CAD, BRCA, CVA, atrial fibrillation, CHF who became dizzy this morning and had an out of hospital arrest.  She reported received CPR.  She is currently in the ER, non responsive and experiencing myoclonic jerking.  We were called to provide GOC and symptom management at EOL.  Clinical Assessment and Goals of Care:  I have reviewed medical records including EPIC notes, labs and imaging, received report from ER MD and bedside RN, assessed the patient and then met at the bedside along with her son, grandson and grand daughter in law to discuss diagnosis prognosis, GOC, EOL wishes, disposition and options.  The patient is non-responsive and appears to be actively dying.  The family learned this from the ER physician originally and re-iterated it when I asked their understanding of the situation.  The family would like for her to be comfortable and allow family as well as their pastor to see and pray over her.  Family felt that she would not want a hospital Chaplain.  Rather she would only want her pastor, Laqueta Due.  Hospice and Palliative Care services outpatient were explained and offered.  However, I do not believe the patient will survive long enough to be transported to a hospice facility.    We discussed making her comfortable with medication and removing her oxygen (she was not on oxygen at home) as she is currently on 6L.  I believe once this is done her time her will be very short (minutes to hours).  Questions and concerns were  addressed.   Family has my contact information and they were encouraged to call with any questions or concerns.     Primary Decision Maker:  NEXT OF KIN son and grand son    SUMMARY OF RECOMMENDATIONS    Full comfort care.  I believe she is too unstable to be transported in an ambulance.  Orders were placed for morphine gtt along with scheduled benzodiazepine (patient takes xanax bid and is having myoclonic jerking) and scheduled robinul.     Wean oxygen to room air as the patient becomes comfortable on morphine boluses.   Code Status/Advance Care Planning:  DNR   Additional Recommendations (Limitations, Scope, Preferences):  Full Comfort Care  Palliative Prophylaxis:   Frequent Pain Assessment  Psycho-social/Spiritual:   Desire for further Chaplaincy support: Patient prefers her own chaplain.  Richard Marcello Moores  Prognosis:  Minutes to hours once oxygen is removed.    Discharge Planning: Anticipated Hospital Death      Primary Diagnoses: Present on Admission: **None**   I have reviewed the medical record, interviewed the patient and family, and examined the patient. The following aspects are pertinent.  Past Medical  History:  Diagnosis Date  . Anginal pain ([place])   . Arthritis    "all over"  . Atrial fibrillation, chronic ([place])   . Cancer of right breast ([place])   . CHF (congestive heart failure) ([place])   . Coronary artery disease   . CVA (cerebral vascular accident) ([place])    "I've had 2; legs weak since" (01/28/2017)  . Gout    "I take RX for it" (01/28/2017)  . Hypertension   . Migraine    "when I was young"   Social History   Socioeconomic History  . Marital status: Widowed    Spouse name: Not on file  . Number of children: Not on file  . Years of education: Not on file  . Highest education level: Not on file  Occupational History  . Not on file  Social Needs  . Financial resource strain: Not on file  . Food insecurity:    Worry: Not on file     Inability: Not on file  . Transportation needs:    Medical: Not on file    Non-medical: Not on file  Tobacco Use  . Smoking status: Never Smoker  . Smokeless tobacco: Never Used  Substance and Sexual Activity  . Alcohol use: No  . Drug use: No  . Sexual activity: Never  Lifestyle  . Physical activity:    Days per week: Not on file    Minutes per session: Not on file  . Stress: Not on file  Relationships  . Social connections:    Talks on phone: Not on file    Gets together: Not on file    Attends religious service: Not on file    Active member of club or organization: Not on file    Attends meetings of clubs or organizations: Not on file    Relationship status: Not on file  Other Topics Concern  . Not on file  Social History Narrative  . Not on file   No family history on file. Scheduled Meds: . glycopyrrolate  0.4 mg Intravenous TID  . LORazepam  1 mg Intravenous BID   Continuous Infusions: . morphine 10 mg/hr (Oct 30, 2018 1144)   PRN Meds:.antiseptic oral rinse, glycopyrrolate **OR** glycopyrrolate **OR** glycopyrrolate, haloperidol **OR** haloperidol **OR** haloperidol lactate, LORazepam **OR** LORazepam **OR** LORazepam, morphine injection, morphine, ondansetron **OR** ondansetron (ZOFRAN) IV, polyvinyl alcohol Allergies  Allergen Reactions  . Codeine Nausea Only  . Penicillins Nausea Only    Has patient had a PCN reaction causing immediate rash, facial/tongue/throat swelling, SOB or lightheadedness with hypotension: No Has patient had a PCN reaction causing severe rash involving mucus membranes or skin necrosis: No Has patient had a PCN reaction that required hospitalization: No Has patient had a PCN reaction occurring within the last 10 years: No If all of the above answers are "NO", then may proceed with Cephalosporin use.  . Sulfa Antibiotics Nausea Only   Review of Systems  Patient non-responsive.  Physical Exam  Well developed elderly female,  non-responsive, myoclonic jerking, eyes rolled back in her head. CV weak, irreg Resp sound irregular and coarse Abdomen soft, nd  Vital Signs: BP (!) 147/71   Pulse 77   Resp 16   Ht 5' (1.524 m)   SpO2 (!) 80%   BMI 28.24 kg/m        SpO2: SpO2: (!) 80 % O2 Device:SpO2: (!) 80 % O2 Flow Rate: .O2 Flow Rate (L/min): 2 L/min  IO: Intake/output summary: No intake or output data in  the 24 hours ending 10-04-18 1159  LBM:   Baseline Weight:   Most recent weight:       Palliative Assessment/Data: 10%     Time In: 11:00 Time Out: 11:50 Time Total: 50 min. Greater than 50%  of this time was spent counseling and coordinating care related to the above assessment and plan.  Signed by: Florentina Jenny, PA-C Palliative Medicine Pager: [phone]  Please contact Palliative Medicine Team phone at [phone] for questions and concerns.  For individual provider: See Shea Evans

## 2018-10-07 NOTE — ED Notes (Signed)
Palliative at bedside

## 2018-10-07 NOTE — ED Notes (Signed)
Family at bedside. 

## 2018-10-07 NOTE — Progress Notes (Signed)
Patient extubated to comfort care per MD. RN at bedside.

## 2018-10-07 NOTE — ED Notes (Signed)
Family at the bedside.

## 2018-10-07 NOTE — Progress Notes (Signed)
Responded to Post CPR page from ED secretary to come and support patient's son . Patient is DNR and is  being made comfort care.  Other sons in route to hospital. Provided ministry of presence, emotional and spiritual support to son   Wynonia Lawman).  Accompanied son back to lobby where he wanted to wait for other brothers.  Chaplain will respond and is  available as needed.

## 2018-10-07 NOTE — ED Notes (Signed)
Pt placed on 5L [place] for comfort.

## 2018-10-07 DEATH — deceased
